# Patient Record
Sex: Female | Born: 1945
Health system: Southern US, Community
[De-identification: ages and names within clinical notes are randomized; demographics above are authoritative.]

## PROBLEM LIST (undated history)

## (undated) DIAGNOSIS — I10 Essential (primary) hypertension: Secondary | ICD-10-CM

## (undated) DIAGNOSIS — I639 Cerebral infarction, unspecified: Secondary | ICD-10-CM

## (undated) DIAGNOSIS — F419 Anxiety disorder, unspecified: Secondary | ICD-10-CM

## (undated) DIAGNOSIS — E785 Hyperlipidemia, unspecified: Secondary | ICD-10-CM

## (undated) HISTORY — DX: Hyperlipidemia, unspecified: E78.5

## (undated) HISTORY — DX: Essential (primary) hypertension: I10

## (undated) HISTORY — DX: Cerebral infarction, unspecified: I63.9

## (undated) HISTORY — PX: EYE SURGERY: SHX253

## (undated) HISTORY — DX: Anxiety disorder, unspecified: F41.9

## (undated) HISTORY — PX: TONSILLECTOMY: SUR1361

## (undated) HISTORY — PX: APPENDECTOMY: SHX54

---

## 1998-06-30 ENCOUNTER — Other Ambulatory Visit: Admission: RE | Admit: 1998-06-30 | Discharge: 1998-06-30 | Payer: Self-pay | Admitting: Obstetrics and Gynecology

## 2006-08-25 ENCOUNTER — Other Ambulatory Visit: Admission: RE | Admit: 2006-08-25 | Discharge: 2006-08-25 | Payer: Self-pay | Admitting: Family Medicine

## 2015-01-31 DIAGNOSIS — E559 Vitamin D deficiency, unspecified: Secondary | ICD-10-CM | POA: Diagnosis not present

## 2015-01-31 DIAGNOSIS — Z0000 Encounter for general adult medical examination without abnormal findings: Secondary | ICD-10-CM | POA: Diagnosis not present

## 2015-01-31 DIAGNOSIS — E785 Hyperlipidemia, unspecified: Secondary | ICD-10-CM | POA: Diagnosis not present

## 2015-01-31 DIAGNOSIS — F17210 Nicotine dependence, cigarettes, uncomplicated: Secondary | ICD-10-CM | POA: Diagnosis not present

## 2015-01-31 DIAGNOSIS — I10 Essential (primary) hypertension: Secondary | ICD-10-CM | POA: Diagnosis not present

## 2016-02-06 LAB — CBC AND DIFFERENTIAL
HEMATOCRIT: 47 % — AB (ref 36–46)
HEMOGLOBIN: 15.6 g/dL (ref 12.0–16.0)
Neutrophils Absolute: 4 /uL
Platelets: 211 10*3/uL (ref 150–399)
WBC: 7.2 10*3/mL

## 2016-02-06 LAB — VITAMIN D 25 HYDROXY (VIT D DEFICIENCY, FRACTURES): Vit D, 25-Hydroxy: 39

## 2016-02-06 LAB — BASIC METABOLIC PANEL
BUN: 12 mg/dL (ref 4–21)
CREATININE: 0.7 mg/dL (ref 0.5–1.1)
Glucose: 96 mg/dL
Potassium: 4.2 mmol/L (ref 3.4–5.3)
SODIUM: 143 mmol/L (ref 137–147)

## 2016-02-06 LAB — LIPID PANEL
CHOLESTEROL: 203 mg/dL — AB (ref 0–200)
HDL: 53 mg/dL (ref 35–70)
LDL Cholesterol: 120 mg/dL
TRIGLYCERIDES: 151 mg/dL (ref 40–160)

## 2016-02-06 LAB — HEPATIC FUNCTION PANEL
ALK PHOS: 81 U/L (ref 25–125)
ALT: 15 U/L (ref 7–35)
AST: 14 U/L (ref 13–35)
Bilirubin, Total: 0.6 mg/dL

## 2016-07-11 ENCOUNTER — Encounter: Payer: Self-pay | Admitting: Family Medicine

## 2016-07-11 ENCOUNTER — Telehealth: Payer: Self-pay

## 2016-07-11 ENCOUNTER — Ambulatory Visit (INDEPENDENT_AMBULATORY_CARE_PROVIDER_SITE_OTHER): Payer: Medicare Other | Admitting: Family Medicine

## 2016-07-11 VITALS — BP 140/82 | HR 77 | Resp 12 | Ht 63.0 in | Wt 158.2 lb

## 2016-07-11 DIAGNOSIS — M25531 Pain in right wrist: Secondary | ICD-10-CM | POA: Diagnosis not present

## 2016-07-11 DIAGNOSIS — M25511 Pain in right shoulder: Secondary | ICD-10-CM | POA: Diagnosis not present

## 2016-07-11 DIAGNOSIS — L02212 Cutaneous abscess of back [any part, except buttock]: Secondary | ICD-10-CM | POA: Diagnosis not present

## 2016-07-11 DIAGNOSIS — I10 Essential (primary) hypertension: Secondary | ICD-10-CM | POA: Diagnosis not present

## 2016-07-11 DIAGNOSIS — L723 Sebaceous cyst: Secondary | ICD-10-CM | POA: Diagnosis not present

## 2016-07-11 MED ORDER — SULFAMETHOXAZOLE-TRIMETHOPRIM 800-160 MG PO TABS: 1.0000 | ORAL_TABLET | Freq: Two times a day (BID) | ORAL | 0 refills | Status: AC

## 2016-07-11 MED ORDER — DICLOFENAC SODIUM 1 % TD GEL: 4.0000 g | Freq: Four times a day (QID) | TRANSDERMAL | 2 refills | Status: DC

## 2016-07-11 NOTE — Progress Notes (Signed)
HPI:   Danielle Trevino is a 71 y.o. female, who is here today with some new concerns and to follow on HTN. I saw Danielle Trevino last about 1-2 years ago at [facility], [place].   Last preventive routine visit: 01/2016.   HTN:  Dx over 10 years ago. She discontinue HCTZ about 12 months ago. Following a healthier diet and she is more consistent with low salt diet. BP reading 130's-low 140's/78-82.  Denies headache, visual changes, chest pain, dyspnea, palpitation, claudication, focal weakness, or edema.  Still smoking, has decreased some.  She has not been interested in preventive screening in the past as well as vaccination.  Concerns today:   Sebaceous cyst:  She has Hx of sebaceous cyst on her back, some have been excised. About 3-4 days ago she started with sudden pain on right mid back area, erythema, and local edema. Her husband drained purulent and odorous material, which helped some but pain is getting worse again, he tried to drain it again but he was unsuccessful. Pain is moderate to severe,"hurts badly". Pain is alleviated by taking Aleve or Tylenol, exacerbated by pressing area when laying down or siting back against chair. She denies chills, fever, decreased appetite, myalgias, or skin rash.   Right shoulder and wrist pain , intermittent for a few months. She denies prior history, pain is mild, achy, exacerbated by repetitive manual activities, alleviated by rest. Currently pain is much better because she stopped activities that were aggravating pain. She denies any trauma, limitation of movement, edema, or erythema.  She denies cervical pain, numbness, tingling, or weakness.   Review of Systems  Constitutional: Negative for activity change, appetite change, fatigue, fever and unexpected weight change.  HENT: Negative for mouth sores, nosebleeds and trouble swallowing.   Eyes: Negative for redness and visual disturbance.  Respiratory: Negative for cough,  shortness of breath and wheezing.   Cardiovascular: Negative for chest pain, palpitations and leg swelling.  Gastrointestinal: Negative for abdominal pain, nausea and vomiting.       Negative for changes in bowel habits.  Genitourinary: Negative for decreased urine volume and hematuria.  Musculoskeletal: Positive for arthralgias. Negative for joint swelling and myalgias.  Skin: Positive for rash. Negative for wound.  Neurological: Negative for syncope, weakness, numbness and headaches.  Psychiatric/Behavioral: Negative for confusion. The patient is not nervous/anxious.      No current outpatient prescriptions on file prior to visit.   No current facility-administered medications on file prior to visit.      Past Medical History:  Diagnosis Date  . Hypertension    Allergies  Allergen Reactions  . Erythromycin     IV    Family History  Problem Relation Age of Onset  . Arthritis Mother   . Heart disease Mother   . Arthritis Father   . Heart disease Father   . Diabetes Maternal Grandfather     Social History   Social History  . Marital status: Married    Spouse name: N/A  . Number of children: N/A  . Years of education: N/A   Social History Main Topics  . Smoking status: Current Every Day Smoker  . Smokeless tobacco: Never Used  . Alcohol use No  . Drug use: No  . Sexual activity: Not Asked   Other Topics Concern  . None   Social History Narrative  . None    Vitals:   07/11/16 1155  BP: 140/82  Pulse: 77  Resp:  12   O2 sat at RA 97% Body mass index is 28.03 kg/m.   Physical Exam  Nursing note and vitals reviewed. Constitutional: She is oriented to person, place, and time. She appears well-developed. No distress.  HENT:  Head: Atraumatic.  Mouth/Throat: Oropharynx is clear and moist and mucous membranes are normal.  Eyes: Conjunctivae and EOM are normal. Pupils are equal, round, and reactive to light.  Cardiovascular: Normal rate and regular  rhythm.   No murmur heard. Pulses:      Dorsalis pedis pulses are 2+ on the right side, and 2+ on the left side.  Respiratory: Effort normal and breath sounds normal. No respiratory distress.  GI: Soft. She exhibits no mass. There is no hepatomegaly. There is no tenderness.  Musculoskeletal: She exhibits no edema.  Right shoulder: No pain upon palpation. Juanetta GoslingHawkins' test neg, drop arm rotator cuff test neg, empty can supraspinatus test neg, cross body adduction test neg, lift-Off Subscapularis test neg. ROM normal but elicits mild pain.  There is no tenderness upon palpation of radial styloid, no edema or erythema appreciated, no limitation of wrist or MCP/IP joint ROM of right thumb. No pain elicited with Finkelstein maneuver.   Lymphadenopathy:       Head (right side): No occipital adenopathy present.       Head (left side): No occipital adenopathy present.    She has no cervical adenopathy.  Neurological: She is alert and oriented to person, place, and time. She has normal strength. Coordination normal.  Skin: Skin is warm. There is erythema.     7-8 cm indurated,ertyematous area, tender,no fluctuant area or active drainage.  Psychiatric: She has a normal mood and affect.  Well groomed, good eye contact.     ASSESSMENT AND PLAN:   Danielle BondsGloria was seen today for cyst on back.  Diagnoses and all orders for this visit:  Back abscess  Underlying sebaceous cyst. Lesion is not ready for I&D at this time. I recommend applying warm compressions. Monitor for worsening symptoms, in which case she may need to go to the ER. Oral antibiotic treatment and recommended. Instructed about warning signs. Follow-up in 7 days if needed.  -     sulfamethoxazole-trimethoprim (BACTRIM DS,SEPTRA DS) 800-160 MG tablet; Take 1 tablet by mouth 2 (two) times daily.  Sebaceous cyst  She has a few sebaceous cyst on her back, some has been infected a few times.She agrees with referral to dermatologist to  discuss excision of some.  -     Ambulatory referral to Dermatology  Hypertension, essential, benign  Adequately controlled otherwise. For now she will continue nonpharmacologic treatment. Caution with NSAIDs, some side effects discussed. DASH-low salt diet to continue. Eye exam recommended, last one about 2 years ago. Continue monitoring BP at home.  Arthralgia of right wrist  ? OA vs mild tenosynovitis. Examination today was otherwise negative. Recommended avoiding activities that could aggravate problem. She can take Tylenol OTC if needed. Follow-up as needed.  Right shoulder pain, unspecified chronicity  ? OA, rotator cuff tendinitis,radicular among some discussed. I don't think imaging is needed at this time. Pain seems to be better at this time. ROM exercises recommended. Topical diclofenac as needed for times per day. Follow-up as needed.   -     diclofenac sodium (VOLTAREN) 1 % GEL; Apply 4 g topically 4 (four) times daily.       Danielle G. SwazilandJordan, MD  [facility]. [place] office.

## 2016-07-11 NOTE — Patient Instructions (Signed)
A few things to remember from today's visit:   Back abscess - Plan: sulfamethoxazole-trimethoprim (BACTRIM DS,SEPTRA DS) 800-160 MG tablet  Sebaceous cyst - Plan: Ambulatory referral to Dermatology  Hypertension, essential, benign  Warm compresses, if drains you can apply gentle pressure or come back to have it open and drained.  If fever,chills,or sudden worsening please seem immediate medial attention.    Please be sure medication list is accurate. If a new problem present, please set up appointment sooner than planned today.

## 2016-07-11 NOTE — Telephone Encounter (Signed)
Received PA request for diclofenac sodium gel. PA submitted & is pending. Key:  [id number]

## 2016-07-12 NOTE — Telephone Encounter (Signed)
Pa approved, form faxed back to pharmacy. 

## 2016-07-17 ENCOUNTER — Telehealth: Payer: Self-pay | Admitting: Family Medicine

## 2016-07-17 NOTE — Telephone Encounter (Signed)
error 

## 2016-07-24 ENCOUNTER — Encounter: Payer: Self-pay | Admitting: Family Medicine

## 2017-01-31 ENCOUNTER — Encounter: Payer: Medicare Other | Admitting: Family Medicine

## 2017-02-27 NOTE — Progress Notes (Signed)
HPI:   [name] is a 72 y.o. female, who is here today for her routine physical.  Last CPE: 01/2016  Regular exercise 3 or more time per week: Walking Following a healthy diet: Yes.   Chronic medical problems: HTN,HLD, hearing loss (wears hearing aids), and generalized OA.   She has not been interested in pharmacologic treatments.  She lives with her husband. Independent ADL's and IADL's. No falls in the past year and denies depression symptoms.  Functional Status Survey: Is the patient deaf or have difficulty hearing?: Yes(Hearing aid.) Does the patient have difficulty seeing, even when wearing glasses/contacts?: No Does the patient have difficulty concentrating, remembering, or making decisions?: No Does the patient have difficulty walking or climbing stairs?: No Does the patient have difficulty dressing or bathing?: No Does the patient have difficulty doing errands alone such as visiting a doctor's office or shopping?: No  Fall Risk  02/28/2017  Falls in the past year? No     Providers she sees regularly:  Eye care provider: Last eye exam 3 years ago.   Depression screen [facility] 2/9 02/28/2017  Decreased Interest 0  Down, Depressed, Hopeless 0  PHQ - 2 Score 0    Mini-Cog - 02/28/17 0946    Normal clock drawing test?  yes         Hearing Screening   125Hz  250Hz  500Hz  1000Hz  2000Hz  3000Hz  4000Hz  6000Hz  8000Hz   Right ear:   Fail Fail Fail  Fail    Left ear:   Fail Fail Fail  Fail      Visual Acuity Screening   Right eye Left eye Both eyes  Without correction: 20/20 20/20 20/20   With correction:        There is no immunization history on file for this patient.  Mammogram: 02/2008 Colonoscopy: Never. DEXA: Never.  Hep C screening: She does not remember having it done, she is not interested.  She has no concerns today.   History of hypertension, she is currently on nonpharmacologic treatment. BP's at home "good" 130's/70's, less  than 150/90 in general. Denies severe/frequent headache, visual changes, chest pain, dyspnea, palpitation, claudication, focal weakness, or edema.  + Smoker, she has decreased number of cigarettes per day a pack of cigarettes last about 3 days.   Review of Systems  Constitutional: Negative for appetite change, fatigue and fever.  HENT: Negative for hearing loss, mouth sores, trouble swallowing and voice change.   Eyes: Negative for photophobia and visual disturbance.  Respiratory: Negative for cough, shortness of breath and wheezing.   Cardiovascular: Negative for chest pain and leg swelling.  Gastrointestinal: Negative for abdominal pain, nausea and vomiting.       No changes in bowel habits.  Endocrine: Negative for cold intolerance and heat intolerance.  Genitourinary: Negative for decreased urine volume, dysuria, hematuria, vaginal bleeding and vaginal discharge.  Musculoskeletal: Positive for arthralgias. Negative for gait problem and neck pain.  Skin: Negative for color change and rash.  Neurological: Negative for seizures, syncope, weakness, numbness and headaches.  Psychiatric/Behavioral: Negative for confusion and sleep disturbance. The patient is not nervous/anxious.   All other systems reviewed and are negative.     Current Outpatient Medications on File Prior to Visit  Medication Sig Dispense Refill  . diclofenac sodium (VOLTAREN) 1 % GEL Apply 4 g topically 4 (four) times daily. 4 Tube 2  . TURMERIC PO Take 1 tablet by mouth daily.     No current facility-administered medications on  file prior to visit.      Past Medical History:  Diagnosis Date  . Hypertension     Past Surgical History:  Procedure Laterality Date  . APPENDECTOMY    . TONSILLECTOMY      Allergies  Allergen Reactions  . Erythromycin     IV    Family History  Problem Relation Age of Onset  . Arthritis Mother   . Heart disease Mother   . Arthritis Father   . Heart disease Father   .  Diabetes Maternal Grandfather     Social History   Socioeconomic History  . Marital status: Married    Spouse name: None  . Number of children: None  . Years of education: None  . Highest education level: None  Social Needs  . Financial resource strain: None  . Food insecurity - worry: None  . Food insecurity - inability: None  . Transportation needs - medical: None  . Transportation needs - non-medical: None  Occupational History  . None  Tobacco Use  . Smoking status: Current Every Day Smoker  . Smokeless tobacco: Never Used  Substance and Sexual Activity  . Alcohol use: No  . Drug use: No  . Sexual activity: None  Other Topics Concern  . None  Social History Narrative  . None     Vitals:   02/28/17 0859  BP: 140/88  Pulse: 81  Resp: 12  Temp: 97.9 F (36.6 C)  SpO2: 98%   Body mass index is 29.43 kg/m.   Wt Readings from Last 3 Encounters:  02/28/17 166 lb 2 oz (75.4 kg)  07/11/16 158 lb 4 oz (71.8 kg)    Physical Exam  Nursing note and vitals reviewed. Constitutional: She is oriented to person, place, and time. She appears well-developed. No distress.  HENT:  Head: Normocephalic and atraumatic.  Right Ear: Tympanic membrane, external ear and ear canal normal.  Left Ear: Tympanic membrane, external ear and ear canal normal.  Mouth/Throat: Uvula is midline, oropharynx is clear and moist and mucous membranes are normal.  Grossly normal hearing,conversational level, with hearing aids.  Eyes: Conjunctivae and EOM are normal. Pupils are equal, round, and reactive to light.  Neck: No tracheal deviation present.  Cardiovascular: Normal rate and regular rhythm.  No murmur heard. Pulses:      Dorsalis pedis pulses are 2+ on the right side, and 2+ on the left side.  Respiratory: Effort normal and breath sounds normal. No respiratory distress.  GI: Soft. She exhibits no mass. There is no hepatomegaly. There is no tenderness.  Genitourinary:  Genitourinary  Comments: Breast:No masses, suspicious skin changes, nipple discharge appreciated bilaterally.  Musculoskeletal: She exhibits no edema.  No signs of synovitis or significant limitation of range of motion of major joints.  Lymphadenopathy:    She has no cervical adenopathy.    She has no axillary adenopathy.       Right: No supraclavicular adenopathy present.       Left: No supraclavicular adenopathy present.  Neurological: She is alert and oriented to person, place, and time. She has normal strength. No cranial nerve deficit. Coordination and gait normal.  Reflex Scores:      Bicep reflexes are 2+ on the right side and 2+ on the left side.      Patellar reflexes are 2+ on the right side and 2+ on the left side. Skin: Skin is warm. No rash noted. No erythema.  No suspicious lesions. Scattered SK lesions, mainly on  upper back, lower back, abdomen, and chest.  Psychiatric: She has a normal mood and affect. Her speech is normal.  Well groomed, good eye contact.      ASSESSMENT AND PLAN:   [name] was seen today for annual exam.  Diagnoses and all orders for this visit:  Lab Results  Component Value Date   CHOL 196 02/28/2017   HDL 48.80 02/28/2017   LDLCALC 120 (H) 02/28/2017   TRIG 136.0 02/28/2017   CHOLHDL 4 02/28/2017   Lab Results  Component Value Date   CREATININE 0.66 02/28/2017   BUN 16 02/28/2017   NA 141 02/28/2017   K 3.9 02/28/2017   CL 104 02/28/2017   CO2 31 02/28/2017   Lab Results  Component Value Date   ALT 11 02/28/2017   AST 13 02/28/2017   ALKPHOS 85 02/28/2017   BILITOT 0.6 02/28/2017    Medicare annual wellness visit, subsequent  We discussed the importance of staying active, physically and mentally, as well as the benefits of a healthy/balance diet. Low impact exercise that involve stretching and strengthing are ideal. We discussed preventive screening for the next 5-10 years, summery of recommendations given in AVS: She is not interested  in any screening test or vaccination, she has refused every time this has been discussed.  Fall prevention.  Advance directives and end of life discussed, she does have POA and living will.    Routine general medical examination at a health care facility  We discussed the importance of regular physical activity and healthy diet for prevention of chronic illness and/or complications. Preventive guidelines reviewed. Vaccination not up to date, refused vaccinations.  Ca++ and vit D supplementation recommended Next CPE in a year.  The 10-year ASCVD risk score Denman George DC Montez Hageman., et al., 2013) is: 21%   Values used to calculate the score:     Age: 71 years     Sex: Female     Is Non-Hispanic African American: No     Diabetic: No     Tobacco smoker: Yes     Systolic Blood Pressure: 140 mmHg     Is BP treated: No     HDL Cholesterol: 48.8 mg/dL     Total Cholesterol: 196 mg/dL  Hyperlipidemia, unspecified hyperlipidemia type  Continue nonpharmacologic treatment for now. Further recommendations will be given according to lab results as well as 10 years CVD risk score. Follow-up in 6-12 months.  -     Lipid panel  Hypertension, essential, benign  Adequately controlled. Continue nonpharmacologic treatment. Monitor BP at home. Instructed about warning signs. DASH-Low-salt diet recommended. Eye exam recommended annually. F/U in 12 months, before if needed.  -     Comprehensive metabolic panel  Hearing loss sensory, bilateral  She has not noted any problem with hearing loss while she is wearing her hearing aids and gross hearing during the visit is adequate.     Return in 1 year (on 02/28/2018) for routine.      Danielle Padin G. [place], MD  [facility]. [place] office.

## 2017-02-28 ENCOUNTER — Encounter: Payer: Self-pay | Admitting: Family Medicine

## 2017-02-28 ENCOUNTER — Ambulatory Visit (INDEPENDENT_AMBULATORY_CARE_PROVIDER_SITE_OTHER): Payer: Medicare Other | Admitting: Family Medicine

## 2017-02-28 VITALS — BP 140/88 | HR 81 | Temp 97.9°F | Resp 12 | Wt 166.1 lb

## 2017-02-28 DIAGNOSIS — I10 Essential (primary) hypertension: Secondary | ICD-10-CM | POA: Diagnosis not present

## 2017-02-28 DIAGNOSIS — Z0000 Encounter for general adult medical examination without abnormal findings: Secondary | ICD-10-CM

## 2017-02-28 DIAGNOSIS — E785 Hyperlipidemia, unspecified: Secondary | ICD-10-CM | POA: Diagnosis not present

## 2017-02-28 DIAGNOSIS — H903 Sensorineural hearing loss, bilateral: Secondary | ICD-10-CM | POA: Diagnosis not present

## 2017-02-28 LAB — COMPREHENSIVE METABOLIC PANEL
ALBUMIN: 4.3 g/dL (ref 3.5–5.2)
ALK PHOS: 85 U/L (ref 39–117)
ALT: 11 U/L (ref 0–35)
AST: 13 U/L (ref 0–37)
BILIRUBIN TOTAL: 0.6 mg/dL (ref 0.2–1.2)
BUN: 16 mg/dL (ref 6–23)
CALCIUM: 9.3 mg/dL (ref 8.4–10.5)
CO2: 31 mEq/L (ref 19–32)
Chloride: 104 mEq/L (ref 96–112)
Creatinine, Ser: 0.66 mg/dL (ref 0.40–1.20)
GFR: 93.45 mL/min (ref >60.00)
Glucose, Bld: 89 mg/dL (ref 70–99)
POTASSIUM: 3.9 meq/L (ref 3.5–5.1)
Sodium: 141 mEq/L (ref 135–145)
Total Protein: 6.4 g/dL (ref 6.0–8.3)

## 2017-02-28 LAB — LIPID PANEL
Cholesterol: 196 mg/dL (ref 0–200)
HDL: 48.8 mg/dL (ref >39.00)
LDL Cholesterol: 120 mg/dL — ABNORMAL HIGH (ref 0–99)
NONHDL: 146.93
TRIGLYCERIDES: 136 mg/dL (ref 0.0–149.0)
Total CHOL/HDL Ratio: 4
VLDL: 27.2 mg/dL (ref 0.0–40.0)

## 2017-02-28 NOTE — Patient Instructions (Addendum)
A few things to remember from today's visit:   Routine general medical examination at a health care facility  Hyperlipidemia, unspecified hyperlipidemia type - Plan: Lipid panel  Hypertension, essential, benign - Plan: Comprehensive metabolic panel   A few tips:  -As we age balance is not as good as it was, so there is a higher risks for falls. Please remove small rugs and furniture that is "in your way" and could increase the risk of falls. Stretching exercises may help with fall prevention: Yoga and Tai Chi are some examples. Low impact exercise is better, so you are not very achy the next day.  -Sun screen and avoidance of direct sun light recommended. Caution with dehydration, if working outdoors be sure to drink enough fluids.  - Some medications are not safe as we age, increases the risk of side effects and can potentially interact with other medication you are also taken;  including some of over the counter medications. Be sure to let me know when you start a new medication even if it is a dietary/vitamin supplement.   -Healthy diet low in red meet/animal fat and sugar + regular physical activity is recommended.       Screening schedule for the next 5-10 years:  Colon cancer screening until 75.  Glaucoma screening/eye exam every 1-2 years.  Mammogram for breast cancer screening annually.  Flu vaccine annually.  Diabetes and cholesterol screening : Done today  Fall prevention   Advance directives:  Please see a lawyer and/or go to this website to help you with advanced directives and designating a health care power of attorney so that your wishes will be followed should you become too ill to make your own medical decisions.  [email or web address]       Please be sure medication list is accurate. If a new problem present, please set up appointment sooner than planned today.

## 2017-05-23 ENCOUNTER — Ambulatory Visit: Payer: Self-pay

## 2017-05-23 NOTE — Telephone Encounter (Signed)
rec'd phone call from pt. Reported 3 separate episodes of vision change with seeing a jagged line in field of vision.  Reported one episode 2 weeks ago, one episode 2 days ago, and one this AM.  It has involved one or both eyes.  Also, reported episode yesterday morning of writing and noting that the letters looked like they were superimposed on each other; after vision cleared, she noted it was not that way.  Reported BP readings:  205/93 4/3, 173/89 @ 1:00 AM 4/4, 138/74 4/4, 150/71 4/5.  Stated she has been very tired for past 2 days, and "just don't feel like myself."  Stated she is under additional stress with father-in-law with vascular dementia.  Reported she is a smoker, but has put away the cigarettes since 05/20/17.  Is no longer on any blood pressure medications.  Advised to monitor BP BID and recore, avoid processed foods and monitor sodium intake, to reduce BP.  Try to find outlet for her stress. Care advice per protocol.  Encouraged to seek ER care if symptoms worsen.  Appt. Given for 05/26/17 with PCP.  Verb. Understanding; agrees with plan.         Reason for Disposition . [1] Brief (now gone) blurred vision AND [2] unexplained  Answer Assessment - Initial Assessment Questions 1. SYMPTOM: "What is the main symptom you are concerned about?" (e.g., weakness, numbness)     Seeing jagged line in bilat. vision 2. ONSET: "When did this start?" (minutes, hours, days; while sleeping)     It started a couple weeks ago; 3 separate events; last event today at 11:00 AM  3. LAST NORMAL: "When was the last time you were normal (no symptoms)?"     Haven't felt like myself for 2 days; c/o fatigue 4. PATTERN "Does this come and go, or has it been constant since it started?"  "Is it present now?"     Intermittent 5. CARDIAC SYMPTOMS: "Have you had any of the following symptoms: chest pain, difficulty breathing, palpitations?"     Denied chest pain, SOB, or heart palpitations.  6. NEUROLOGIC SYMPTOMS: "Have  you had any of the following symptoms: headache, dizziness, vision loss, double vision, changes in speech, unsteady on your feet?"     Denied H/A, dizziness, no speech difficulty, vision loss, unsteadiness, or numbness or weakness in extremities; was writing and noticed letters appeared to be on top of each other.  7. OTHER SYMPTOMS: "Do you have any other symptoms?"     Had BP 205/93, P 92 on 4/3, following an episode of seeing jagged line; 173/89, P 92 @ 1:00 AM, 4/4     4/4 BP 138/74, P 70; 4/5 BP 150/71, P 66  this AM  8. PREGNANCY: "Is there any chance you are pregnant?" "When was your last menstrual period?"     No  Answer Assessment - Initial Assessment Questions 1. DESCRIPTION: "What is the vision loss like? Describe it for me." (e.g., complete vision loss, blurred vision, double vision, floaters, etc.)     Seeing "jagged line" in bilateral vision; stated "I have floaters, and it didn't look like a floater." 2. LOCATION: "One or both eyes?" If one, ask: "Which eye?"     Episode 2 weeks ago, involved both eyes; episode 4/3 involved left eye; episode this AM involved both eyes  3. SEVERITY: "Can you see anything?" If so, ask: "What can you see?" (e.g., fine print)     Stated the episode lasted briefly 4. ONSET: "When  did this begin?" "Did it start suddenly or has this been gradual?"     3 separate episodes 5. PATTERN: "Does this come and go, or has it been constant since it started?"     Comes and goes 6. PAIN: "Is there any pain in your eye(s)?"  (Scale 1-10; or mild, moderate, severe)     Denied pain 7. CONTACTS-GLASSES: "Do you wear contacts or glasses?"     Did no ask 8. CAUSE: "What do you think is causing this visual problem?"     Unsure; under increased stress  9. OTHER SYMPTOMS: "Do you have any other symptoms?" (e.g., confusion, headache, arm or leg weakness, speech problems)     Denied headache, dizziness, speech problems, vision loss, weakness or numbness in  extremities 10. PREGNANCY: "Is there any chance you are pregnant?" "When was your last menstrual period?"       no  Protocols used: VISION LOSS OR CHANGE-A-AH, NEUROLOGIC DEFICIT-A-AH

## 2017-05-26 ENCOUNTER — Ambulatory Visit: Payer: Medicare Other | Admitting: Family Medicine

## 2017-05-26 ENCOUNTER — Encounter: Payer: Self-pay | Admitting: Family Medicine

## 2017-05-26 VITALS — BP 160/100 | HR 77 | Temp 99.0°F | Resp 12 | Ht 63.0 in | Wt 161.1 lb

## 2017-05-26 DIAGNOSIS — I10 Essential (primary) hypertension: Secondary | ICD-10-CM | POA: Diagnosis not present

## 2017-05-26 DIAGNOSIS — F419 Anxiety disorder, unspecified: Secondary | ICD-10-CM

## 2017-05-26 DIAGNOSIS — H539 Unspecified visual disturbance: Secondary | ICD-10-CM | POA: Diagnosis not present

## 2017-05-26 MED ORDER — LISINOPRIL-HYDROCHLOROTHIAZIDE 10-12.5 MG PO TABS: 1.0000 | ORAL_TABLET | Freq: Every day | ORAL | 0 refills | Status: DC

## 2017-05-26 NOTE — Progress Notes (Signed)
ACUTE VISIT   HPI:  Chief Complaint  Patient presents with  . Hypertension    has been elevated since Wednesday, say swiggly lines in left eye for 2 days    [name] is a 72 y.o. female, who is here today with her husband complaining of worsening hypertension and visual disturbances for the past 2-3 weeks, for the past 2 days seems to be getting worse.  She has history of hypertension, currently she is on nonpharmacologic treatment. She was taking HCTZ and still 1-2 years ago, she was able to control her BP with low-salt diet. Home BP readings: 150/78, 179/77, 206/94, 194/91, 182/86.  She has been under a lot of stress due to father-in-law illness.  She has not smoke for about a week.  "swiggly/jagged lines" on visual field, she has had 3 episodes in the past 2-3 weeks.  2 prior episodes on left visual field and last one was on both visual fields. No vision loss, eye pain, conjunctival erythema, headache, nausea, vomiting, focal deficit, or MS changes. Visual abnormalities usually last about 5 minutes. She has not identified exacerbating or alleviating factors. She already has an appointment with her ophthalmologist next Monday.  According to her husband, for a while she has had occasional similar episodes, thought to be associated to eating fried fish.   Visual Acuity Screening   Right eye Left eye Both eyes  Without correction: 20/20 20/20 20/20   With correction:       Review of Systems  Constitutional: Positive for fatigue. Negative for activity change, appetite change and fever.  HENT: Negative for mouth sores, nosebleeds and trouble swallowing.   Eyes: Positive for visual disturbance. Negative for pain and redness.  Respiratory: Negative for cough, shortness of breath and wheezing.   Cardiovascular: Negative for chest pain, palpitations and leg swelling.  Gastrointestinal: Negative for abdominal pain, nausea and vomiting.       Negative for changes in  bowel habits.  Endocrine: Negative for cold intolerance and heat intolerance.  Genitourinary: Negative for decreased urine volume, dysuria and hematuria.  Musculoskeletal: Negative for gait problem and myalgias.  Skin: Negative for pallor and rash.  Neurological: Negative for seizures, syncope, weakness, numbness and headaches.  Hematological: Negative for adenopathy. Does not bruise/bleed easily.  Psychiatric/Behavioral: Negative for confusion. The patient is nervous/anxious.       Current Outpatient Medications on File Prior to Visit  Medication Sig Dispense Refill  . TURMERIC PO Take 1 tablet by mouth daily.    . diclofenac sodium (VOLTAREN) 1 % GEL Apply 4 g topically 4 (four) times daily. (Patient not taking: Reported on 05/26/2017) 4 Tube 2   No current facility-administered medications on file prior to visit.      Past Medical History:  Diagnosis Date  . Hypertension    Allergies  Allergen Reactions  . Erythromycin     IV    Social History   Socioeconomic History  . Marital status: Married    Spouse name: Not on file  . Number of children: Not on file  . Years of education: Not on file  . Highest education level: Not on file  Occupational History  . Not on file  Social Needs  . Financial resource strain: Not on file  . Food insecurity:    Worry: Not on file    Inability: Not on file  . Transportation needs:    Medical: Not on file    Non-medical: Not on file  Tobacco Use  . Smoking status: Current Every Day Smoker  . Smokeless tobacco: Never Used  Substance and Sexual Activity  . Alcohol use: No  . Drug use: No  . Sexual activity: Not on file  Lifestyle  . Physical activity:    Days per week: Not on file    Minutes per session: Not on file  . Stress: Not on file  Relationships  . Social connections:    Talks on phone: Not on file    Gets together: Not on file    Attends religious service: Not on file    Active member of club or organization: Not  on file    Attends meetings of clubs or organizations: Not on file    Relationship status: Not on file  Other Topics Concern  . Not on file  Social History Narrative  . Not on file    Vitals:   05/26/17 1614  BP: (!) 160/100  Pulse: 77  Resp: 12  Temp: 99 F (37.2 C)   Body mass index is 28.54 kg/m.    Physical Exam  Nursing note and vitals reviewed. Constitutional: She is oriented to person, place, and time. She appears well-developed. No distress.  HENT:  Head: Normocephalic and atraumatic.  Mouth/Throat: Oropharynx is clear and moist and mucous membranes are normal.  Eyes: Pupils are equal, round, and reactive to light. Conjunctivae and EOM are normal.  Neck: No tracheal deviation present. No thyroid mass and no thyromegaly present.  Cardiovascular: Normal rate and regular rhythm.  No murmur heard. Pulses:      Dorsalis pedis pulses are 2+ on the right side, and 2+ on the left side.  Respiratory: Effort normal and breath sounds normal. No respiratory distress.  GI: Soft. She exhibits no mass. There is no hepatomegaly. There is no tenderness.  Musculoskeletal: She exhibits no edema.  Lymphadenopathy:    She has no cervical adenopathy.  Neurological: She is alert and oriented to person, place, and time. She has normal strength. No cranial nerve deficit. Coordination and gait normal.  Skin: Skin is warm. No rash noted. No erythema.  Psychiatric: She has a normal mood and affect.  Well groomed, good eye contact.    ASSESSMENT AND PLAN:   [name] was seen today for hypertension.  Orders Placed This Encounter  Procedures  . Basic metabolic panel  . CBC     Hypertension, essential, benign Not well controlled. Possible complications of elevated BP discussed. In the past she was on lisinopril HCTZ, well-tolerated. Lisinopril-HCTZ 10-12.5 mg started today.   Continue low-salt diet  Continue monitoring BP at home. Instructed about warning signs. Follow-up  in 3-4 weeks, before if needed.    Visual disturbance  Possible etiologies discussed including glaucoma or retinal abnormalities. It seems like she has had visual abnormalities occasionally for a while but seem more frequent for the past few days. She has appointment with her ophthalmologist next week, she was instructed to go to the ER if vision loss or worsening symptoms.   -     Basic metabolic panel -     CBC    Anxiety disorder, unspecified type  We discussed a few pharmacologic treatment options as well as side effects. For now we will hold on medication. Psychotherapy might help. We will consider low-dose of SSRI next visit.     Return in about 3 weeks (around 06/16/2017) for HTN.     -Ms.Lindley MagnusGloria H Enriques was advised to seek immediate medical attention if  sudden worsening symptoms.      Yuki Brunsman G. [place], MD  [facility]. [place] office.

## 2017-05-26 NOTE — Assessment & Plan Note (Signed)
Not well controlled. Possible complications of elevated BP discussed. In the past she was on lisinopril HCTZ, well-tolerated. Lisinopril-HCTZ 10-12.5 mg started today.   Continue low-salt diet  Continue monitoring BP at home. Instructed about warning signs. Follow-up in 3-4 weeks, before if needed.

## 2017-05-26 NOTE — Patient Instructions (Signed)
A few things to remember from today's visit:   Hypertension, essential, benign - Plan: lisinopril-hydrochlorothiazide (PRINZIDE,ZESTORETIC) 10-12.5 MG tablet, Basic metabolic panel, CBC  Visual disturbance - Plan: Basic metabolic panel, CBC   Please be sure medication list is accurate. If a new problem present, please set up appointment sooner than planned today.

## 2017-05-27 LAB — BASIC METABOLIC PANEL
BUN: 9 mg/dL (ref 6–23)
CHLORIDE: 104 meq/L (ref 96–112)
CO2: 31 meq/L (ref 19–32)
Calcium: 9.6 mg/dL (ref 8.4–10.5)
Creatinine, Ser: 0.7 mg/dL (ref 0.40–1.20)
GFR: 87.26 mL/min (ref >60.00)
Glucose, Bld: 83 mg/dL (ref 70–99)
POTASSIUM: 4.2 meq/L (ref 3.5–5.1)
Sodium: 142 mEq/L (ref 135–145)

## 2017-05-27 LAB — CBC
HEMATOCRIT: 46.1 % — AB (ref 36.0–46.0)
HEMOGLOBIN: 15.8 g/dL — AB (ref 12.0–15.0)
MCHC: 34.3 g/dL (ref 30.0–36.0)
MCV: 88.7 fl (ref 78.0–100.0)
Platelets: 220 10*3/uL (ref 150.0–400.0)
RBC: 5.19 Mil/uL — AB (ref 3.87–5.11)
RDW: 13.7 % (ref 11.5–15.5)
WBC: 7.4 10*3/uL (ref 4.0–10.5)

## 2017-05-29 ENCOUNTER — Ambulatory Visit: Payer: Medicare Other | Admitting: Family Medicine

## 2017-05-29 ENCOUNTER — Encounter: Payer: Self-pay | Admitting: Family Medicine

## 2017-05-29 VITALS — BP 150/72 | HR 67 | Temp 98.1°F | Ht 63.0 in | Wt 159.4 lb

## 2017-05-29 DIAGNOSIS — I10 Essential (primary) hypertension: Secondary | ICD-10-CM

## 2017-05-29 NOTE — Progress Notes (Signed)
   Subjective:    Patient ID: Danielle Trevino, female    DOB: 04/10/1945, 72 y.o.   MRN: [id number]  HPI Here with her husband to ask about BP. She was seen here 4 days ago by Dr. [place]Jordan for elevated BP at home to 180 systolic, while the diastolics always remain below 90. She had some blurred vision but denied any headache or SOB or chest pain. She had a CBC and BMET drawn that day, all of which was normal. She was started on Lisinopril HCT that day. Since then she has been getting some high BP readings at home. With the highest yesterday at 179/872. She saw her eye doctor this morning, and other than some early cataracts her eyes are fine. She admits to being under a lot of stress lately because her husband's father has worsening dementia, and she worries about him.    Review of Systems  Constitutional: Negative.   Respiratory: Negative.   Cardiovascular: Negative.   Neurological: Negative.        Objective:   Physical Exam  Constitutional: She is oriented to person, place, and time. She appears well-developed and well-nourished.  Cardiovascular: Normal rate, regular rhythm, normal heart sounds and intact distal pulses.  Pulmonary/Chest: Effort normal and breath sounds normal. No respiratory distress. She has no wheezes. She has no rales.  Musculoskeletal: She exhibits no edema.  Neurological: She is alert and oriented to person, place, and time.          Assessment & Plan:  I reassured them that her BP is elevated but not to an acutely dangerous level. The new BP medication will help but it takes at least a week before it can reach a therapeutic level in her body. I suggested that give this a little more time to start working. Check the BP no more than twice a day. Stress is certainly playing a role, so I suggested she get some exercise to help with this. Follow up with Dr. [place]Jordan as scheduled in about 2 weeks.  Gershon CraneStephen Brooklyne Radke, MD

## 2017-06-01 ENCOUNTER — Encounter: Payer: Self-pay | Admitting: Family Medicine

## 2017-06-15 NOTE — Progress Notes (Signed)
Danielle Trevino is a 72 y.o.female, who is here today with her husband to follow on HTN. Last seen on 05/26/17, when BP was elevated, so started on Lisinopril-HCTZ 10-12.5 mg daily.  She is taking medications as instructed.  15 minutes after taking medication she feels fatigue/sleepy, improves after taking a nap.  Still having elevated BPs intermittently,minly at night. 110's-170's/70-80's   Since her last visit she has seen her eye care provider, otherwise negative examination. She was told she is having "visual auras.", related to elevated BPs.  She has not noted unusual headache, visual changes, exertional chest pain, dyspnea,focal weakness, or edema.    Lab Results  Component Value Date   CREATININE 0.70 05/26/2017   BUN 9 05/26/2017   NA 142 05/26/2017   K 4.2 05/26/2017   CL 104 05/26/2017   CO2 31 05/26/2017    She has not smoked in a month.   Review of Systems  Constitutional: Positive for fatigue. Negative for activity change, appetite change and fever.  HENT: Negative for mouth sores and nosebleeds.   Eyes: Negative for redness and visual disturbance.  Respiratory: Negative for cough, shortness of breath and wheezing.   Cardiovascular: Negative for chest pain, palpitations and leg swelling.  Gastrointestinal: Negative for abdominal pain, nausea and vomiting.       Negative for changes in bowel habits.  Genitourinary: Negative for decreased urine volume and hematuria.  Neurological: Negative for syncope, weakness and headaches.     Current Outpatient Medications on File Prior to Visit  Medication Sig Dispense Refill  . lisinopril-hydrochlorothiazide (PRINZIDE,ZESTORETIC) 10-12.5 MG tablet Take 1 tablet by mouth daily. 90 tablet 0  . TURMERIC PO Take 1 tablet by mouth daily.     No current facility-administered medications on file prior to visit.      Past Medical History:  Diagnosis Date  . Hypertension     Allergies  Allergen Reactions  .  Erythromycin     IV    Social History   Socioeconomic History  . Marital status: Married    Spouse name: Not on file  . Number of children: Not on file  . Years of education: Not on file  . Highest education level: Not on file  Occupational History  . Not on file  Social Needs  . Financial resource strain: Not on file  . Food insecurity:    Worry: Not on file    Inability: Not on file  . Transportation needs:    Medical: Not on file    Non-medical: Not on file  Tobacco Use  . Smoking status: Former Smoker    Last attempt to quit: 05/19/2017    Years since quitting: 0.0  . Smokeless tobacco: Never Used  Substance and Sexual Activity  . Alcohol use: No  . Drug use: No  . Sexual activity: Not on file  Lifestyle  . Physical activity:    Days per week: Not on file    Minutes per session: Not on file  . Stress: Not on file  Relationships  . Social connections:    Talks on phone: Not on file    Gets together: Not on file    Attends religious service: Not on file    Active member of club or organization: Not on file    Attends meetings of clubs or organizations: Not on file    Relationship status: Not on file  Other Topics Concern  . Not on file  Social History Narrative  .  Not on file    Vitals:   06/16/17 1534  BP: 124/80  Pulse: 66  Resp: 12  Temp: 98.2 F (36.8 C)  SpO2: 97%   Body mass index is 29.23 kg/m.   Physical Exam  Nursing note and vitals reviewed. Constitutional: She is oriented to person, place, and time. She appears well-developed. No distress.  HENT:  Head: Normocephalic and atraumatic.  Mouth/Throat: Oropharynx is clear and moist and mucous membranes are normal.  Eyes: Pupils are equal, round, and reactive to light. Conjunctivae are normal.  Cardiovascular: Normal rate and regular rhythm.  No murmur heard. Pulses:      Dorsalis pedis pulses are 2+ on the right side, and 2+ on the left side.  Respiratory: Effort normal and breath sounds  normal. No respiratory distress.  GI: Soft. She exhibits no mass. There is no hepatomegaly. There is no tenderness.  Musculoskeletal: She exhibits no edema.  Lymphadenopathy:    She has no cervical adenopathy.  Neurological: She is alert and oriented to person, place, and time. She has normal strength. Gait normal.  Skin: Skin is warm. No erythema.  Psychiatric: She has a normal mood and affect.  Well groomed, good eye contact.    ASSESSMENT AND PLAN:   Danielle Trevino was seen today for follow-up.  Orders Placed This Encounter  Procedures  . Basic metabolic panel    Hypertension, essential, benign BP improved but is still having some elevated SBP's. She would like to continue same dose of lisinopril.  She agrees with changing to HCTZ 25 mg in the morning and Lisinopril 10 mg at night. If BP still elevated in 1 to 2 weeks, instructed to increase lisinopril from 10 mg to 20 mg. Continue low salt diet. We discussed possible complications of elevated BP. Follow-up in 3 months.   I am very glad she finally quit the smoking, we discussed benefits upon doing, congratulated.   -Danielle Trevino is to return sooner than planned today if new concerns arise.     Betty G. [place], MD  [facility]. [place] office.

## 2017-06-16 ENCOUNTER — Encounter: Payer: Self-pay | Admitting: Family Medicine

## 2017-06-16 ENCOUNTER — Ambulatory Visit: Payer: Medicare Other | Admitting: Family Medicine

## 2017-06-16 VITALS — BP 124/80 | HR 66 | Temp 98.2°F | Resp 12 | Ht 63.0 in | Wt 165.0 lb

## 2017-06-16 DIAGNOSIS — I10 Essential (primary) hypertension: Secondary | ICD-10-CM

## 2017-06-16 MED ORDER — HYDROCHLOROTHIAZIDE 25 MG PO TABS: 25.0000 mg | ORAL_TABLET | Freq: Every day | ORAL | 0 refills | Status: DC

## 2017-06-16 MED ORDER — LISINOPRIL 10 MG PO TABS: 10.0000 mg | ORAL_TABLET | Freq: Every day | ORAL | 0 refills | Status: DC

## 2017-06-16 NOTE — Assessment & Plan Note (Addendum)
BP improved but is still having some elevated SBP's. She would like to continue same dose of lisinopril.  She agrees with changing to HCTZ 25 mg in the morning and Lisinopril 10 mg at night. If BP still elevated in 1 to 2 weeks, instructed to increase lisinopril from 10 mg to 20 mg. We discussed some side effects of medications. Continue low salt diet. We discussed possible complications of elevated BP. Follow-up in 3 months.

## 2017-06-16 NOTE — Patient Instructions (Addendum)
A few things to remember from today's visit:   Hypertension, essential, benign - Plan: Basic metabolic panel  Hydrochlorothiazide 25 mg in the morning and lisinopril 10 mg at bedtime. It blood pressure still elevated in 1 to 2 weeks we will increase lisinopril from 10 to 20 mg.      Hypertension Hypertension is another name for high blood pressure. High blood pressure forces your heart to work harder to pump blood. This can cause problems over time. There are two numbers in a blood pressure reading. There is a top number (systolic) over a bottom number (diastolic). It is best to have a blood pressure below 120/80. Healthy choices can help lower your blood pressure. You may need medicine to help lower your blood pressure if:  Your blood pressure cannot be lowered with healthy choices.  Your blood pressure is higher than 130/80.  Follow these instructions at home: Eating and drinking  If directed, follow the DASH eating plan. This diet includes: ? Filling half of your plate at each meal with fruits and vegetables. ? Filling one quarter of your plate at each meal with whole grains. Whole grains include whole wheat pasta, brown rice, and whole grain bread. ? Eating or drinking low-fat dairy products, such as skim milk or low-fat yogurt. ? Filling one quarter of your plate at each meal with low-fat (lean) proteins. Low-fat proteins include fish, skinless chicken, eggs, beans, and tofu. ? Avoiding fatty meat, cured and processed meat, or chicken with skin. ? Avoiding premade or processed food.  Eat less than 1,500 mg of salt (sodium) a day.  Limit alcohol use to no more than 1 drink a day for nonpregnant women and 2 drinks a day for men. One drink equals 12 oz of beer, 5 oz of wine, or 1 oz of hard liquor. Lifestyle  Work with your doctor to stay at a healthy weight or to lose weight. Ask your doctor what the best weight is for you.  Get at least 30 minutes of exercise that causes your  heart to beat faster (aerobic exercise) most days of the week. This may include walking, swimming, or biking.  Get at least 30 minutes of exercise that strengthens your muscles (resistance exercise) at least 3 days a week. This may include lifting weights or pilates.  Do not use any products that contain nicotine or tobacco. This includes cigarettes and e-cigarettes. If you need help quitting, ask your doctor.  Check your blood pressure at home as told by your doctor.  Keep all follow-up visits as told by your doctor. This is important. Medicines  Take over-the-counter and prescription medicines only as told by your doctor. Follow directions carefully.  Do not skip doses of blood pressure medicine. The medicine does not work as well if you skip doses. Skipping doses also puts you at risk for problems.  Ask your doctor about side effects or reactions to medicines that you should watch for. Contact a doctor if:  You think you are having a reaction to the medicine you are taking.  You have headaches that keep coming back (recurring).  You feel dizzy.  You have swelling in your ankles.  You have trouble with your vision. Get help right away if:  You get a very bad headache.  You start to feel confused.  You feel weak or numb.  You feel faint.  You get very bad pain in your: ? Chest. ? Belly (abdomen).  You throw up (vomit) more than once.  You have trouble breathing. Summary  Hypertension is another name for high blood pressure.  Making healthy choices can help lower blood pressure. If your blood pressure cannot be controlled with healthy choices, you may need to take medicine. This information is not intended to replace advice given to you by your health care provider. Make sure you discuss any questions you have with your health care provider. Document Released: 07/24/2007 Document Revised: 01/03/2016 Document Reviewed: 01/03/2016 Elsevier Interactive Patient  Education  2018 [employer].  Please be sure medication list is accurate. If a new problem present, please set up appointment sooner than planned today.

## 2017-06-17 LAB — BASIC METABOLIC PANEL
BUN: 16 mg/dL (ref 6–23)
CALCIUM: 9.4 mg/dL (ref 8.4–10.5)
CO2: 28 meq/L (ref 19–32)
CREATININE: 0.75 mg/dL (ref 0.40–1.20)
Chloride: 102 mEq/L (ref 96–112)
GFR: 80.57 mL/min (ref >60.00)
Glucose, Bld: 75 mg/dL (ref 70–99)
Potassium: 4.1 mEq/L (ref 3.5–5.1)
Sodium: 141 mEq/L (ref 135–145)

## 2017-06-22 ENCOUNTER — Encounter: Payer: Self-pay | Admitting: Family Medicine

## 2017-07-16 ENCOUNTER — Ambulatory Visit: Payer: Medicare Other | Admitting: Family Medicine

## 2017-07-16 ENCOUNTER — Ambulatory Visit: Payer: Self-pay

## 2017-07-16 NOTE — Telephone Encounter (Signed)
Noted  

## 2017-07-16 NOTE — Telephone Encounter (Signed)
Telephone call from pt. Stating her blood  Was elevated last night at 3:30 am .  Value was 160/ 83.  Hr 89  Pt. Stated another value was  169/77. Latest value quoted today  was 176/ 68 hr 79.    Denies any SOB, dizziness, nor blurred vision.  States she had a headsache  Pt.  Took extra strength tylenol . Stated it did provide  relief.   Question pt if she was drinking plenty of fluids, and monitoring her salt intake .Pt. Stated she had a salad for dinner.  Pt. Also states she is taking her  medications as directed.  Provided care advice recommendations to  pt. Voiced understanding.  Appt. Scheduled.  Reason for Disposition . Healthy diet, questions about  Answer Assessment - Initial Assessment Questions 1. BLOOD PRESSURE: "What is the blood pressure?" "Did you take at least two measurements 5 minutes apart?"     176/68 double pump 2. ONSET: "When did you take your blood pressure?"     Last night  330am 3. HOW: "How did you obtain the blood pressure?" (e.g., visiting nurse, automatic home BP monitor)    Digital blood pressure machine 4. HISTORY: "Do you have a history of high blood pressure?"     Dx in April2019 5. MEDICATIONS: "Are you taking any medications for blood pressure?" "Have you missed any doses recently?"     HCTZ 6. OTHER SYMPTOMS: "Do you have any symptoms?" (e.g., headache, chest pain, blurred vision, difficulty breathing, weakness) No  Protocols used: HIGH BLOOD PRESSURE-A-AH

## 2017-07-17 ENCOUNTER — Ambulatory Visit: Payer: Medicare Other | Admitting: Family Medicine

## 2017-07-17 ENCOUNTER — Encounter: Payer: Self-pay | Admitting: Family Medicine

## 2017-07-17 VITALS — BP 150/80 | HR 84 | Temp 98.2°F | Resp 12 | Ht 63.0 in | Wt 162.6 lb

## 2017-07-17 DIAGNOSIS — F419 Anxiety disorder, unspecified: Secondary | ICD-10-CM

## 2017-07-17 DIAGNOSIS — R51 Headache: Secondary | ICD-10-CM | POA: Diagnosis not present

## 2017-07-17 DIAGNOSIS — R519 Headache, unspecified: Secondary | ICD-10-CM

## 2017-07-17 DIAGNOSIS — I10 Essential (primary) hypertension: Secondary | ICD-10-CM | POA: Diagnosis not present

## 2017-07-17 MED ORDER — DIAZEPAM 5 MG PO TABS: 2.5000 mg | ORAL_TABLET | Freq: Every day | ORAL | 0 refills | Status: DC | PRN

## 2017-07-17 NOTE — Assessment & Plan Note (Signed)
After discussion of few options, including pharmacologic (SSRI/prn benzo) and cognitive therapy, she would like to try diazepam daily as needed. We discussed some side effects of benzodiazepines in general. She will try Diazepam 5 mg 0.5 to 1 tablet daily as needed. She is familiar with relaxation/meditation exercises,whihc she does at church Tuesdays.Recommend doing it daily. She will keep next follow-up appointment.

## 2017-07-17 NOTE — Patient Instructions (Addendum)
A few things to remember from today's visit:   Hypertension, essential, benign  Anxiety disorder, unspecified type - Plan: diazepam (VALIUM) 5 MG tablet  Nonintractable episodic headache, unspecified headache type - Plan: MR Brain Wo Contrast  Other options are clonazepam or propranolol as needed. Keep next appointment. Continue monitoring BP.    Please be sure medication list is accurate. If a new problem present, please set up appointment sooner than planned today.

## 2017-07-17 NOTE — Progress Notes (Addendum)
ACUTE VISIT   HPI:  Chief Complaint  Patient presents with  . Hypertension    Danielle Trevino is a 72 y.o. female, who is here today with her husband complaining of elevated BP since yesterday, 170's/60's She is currently on HCTZ 25 mg in the morning and Lisinopril 10 mg at night. BP readings before yesterday 110s-120s/60-70s. She attributes her elevated BP to stress. This morning BP was better, 133/76.  Her father-in-law has been sick and usually she takes care of him on Wednesdays, his caregiver (daughter) has had shoulder issues.  Negative for chest pain, dyspnea, palpitation, claudication, focal weakness, or edema.  Lab Results  Component Value Date   CREATININE 0.75 06/16/2017   BUN 16 06/16/2017   NA 141 06/16/2017   K 4.1 06/16/2017   CL 102 06/16/2017   CO2 28 06/16/2017   Yesterday she had headache for 12 hours, 4 hours of severe headache then mild.  Headache was preceded by right visual "aura", which she has described before as "swiggly/jagged lines in peripheral visual field,transiend. She has been successful with smoking cessation. + HLD.   She has had eye exam within the past 3 months and reported as negative. No focal deficit associated. She has not identified exacerbating or alleviating factors for headache. She has taken Tylenol a few times.    + Anxiety,no prior Hx. Family stress and friends' illness seems to exacerbate problem. She has felt like crying a few times since her last visit, no history of depression.  Negative suicidal thoughts.   Review of Systems  Constitutional: Negative for activity change, appetite change, fatigue and fever.  HENT: Negative for mouth sores, nosebleeds and trouble swallowing.   Eyes: Positive for visual disturbance. Negative for redness.  Respiratory: Negative for cough, shortness of breath and wheezing.   Cardiovascular: Negative for chest pain, palpitations and leg swelling.  Gastrointestinal:  Negative for abdominal pain, nausea and vomiting.       Negative for changes in bowel habits.  Genitourinary: Negative for decreased urine volume and hematuria.  Neurological: Positive for headaches. Negative for syncope and weakness.  Psychiatric/Behavioral: Negative for confusion. The patient is nervous/anxious.       Current Outpatient Medications on File Prior to Visit  Medication Sig Dispense Refill  . hydrochlorothiazide (HYDRODIURIL) 25 MG tablet Take 1 tablet (25 mg total) by mouth daily. 90 tablet 0  . lisinopril (PRINIVIL,ZESTRIL) 10 MG tablet Take 1 tablet (10 mg total) by mouth daily. 90 tablet 0  . TURMERIC PO Take 1 tablet by mouth daily.     No current facility-administered medications on file prior to visit.      Past Medical History:  Diagnosis Date  . Hypertension    Allergies  Allergen Reactions  . Erythromycin     IV    Social History   Socioeconomic History  . Marital status: Married    Spouse name: Not on file  . Number of children: Not on file  . Years of education: Not on file  . Highest education level: Not on file  Occupational History  . Not on file  Social Needs  . Financial resource strain: Not on file  . Food insecurity:    Worry: Not on file    Inability: Not on file  . Transportation needs:    Medical: Not on file    Non-medical: Not on file  Tobacco Use  . Smoking status: Former Smoker    Last attempt to quit:  05/19/2017    Years since quitting: 0.1  . Smokeless tobacco: Never Used  Substance and Sexual Activity  . Alcohol use: No  . Drug use: No  . Sexual activity: Not on file  Lifestyle  . Physical activity:    Days per week: Not on file    Minutes per session: Not on file  . Stress: Not on file  Relationships  . Social connections:    Talks on phone: Not on file    Gets together: Not on file    Attends religious service: Not on file    Active member of club or organization: Not on file    Attends meetings of clubs or  organizations: Not on file    Relationship status: Not on file  Other Topics Concern  . Not on file  Social History Narrative  . Not on file    Vitals:   07/17/17 1134 07/17/17 1215  BP: (!) 150/84 (!) 150/80  Pulse: 84   Resp: 12   Temp: 98.2 F (36.8 C)   SpO2: 97%    Body mass index is 28.8 kg/m.   Physical Exam  Nursing note and vitals reviewed. Constitutional: She is oriented to person, place, and time. She appears well-developed. No distress.  HENT:  Head: Normocephalic and atraumatic.  Mouth/Throat: Oropharynx is clear and moist and mucous membranes are normal.  Eyes: Pupils are equal, round, and reactive to light. Conjunctivae and EOM are normal.  Cardiovascular: Normal rate and regular rhythm.  No murmur heard. Pulses:      Dorsalis pedis pulses are 2+ on the right side, and 2+ on the left side.  Respiratory: Effort normal and breath sounds normal. No respiratory distress.  GI: Soft. She exhibits no mass. There is no hepatomegaly. There is no tenderness.  Musculoskeletal: She exhibits no edema.  Lymphadenopathy:    She has no cervical adenopathy.  Neurological: She is alert and oriented to person, place, and time. She has normal strength. No cranial nerve deficit. Gait normal.  Skin: Skin is warm. No erythema.  Psychiatric: She has a normal mood and affect.  Well groomed, good eye contact.    ASSESSMENT AND PLAN:  Ms. Portia was seen today for hypertension.  Orders Placed This Encounter  Procedures  . MR Brain Wo Contrast    Nonintractable episodic headache, unspecified headache type  No prior history of headache, given the fact that this is associated with elevated BP and visual disturbances, I think is appropriate to obtain brain imaging. She was instructed about warning signs. She can take Tylenol 325 mg every 4 hours as needed. Avoid NSAIDs.  -     MR Brain Wo Contrast; Future   Hypertension, essential, benign BP elevated today. She has had  adequate BP is prior to yesterday. It seems to be related to stress. For now she will continue current management. One option is to add Propranolol daily as needed for elevated BP. Continue monitoring BP regularly. Instructed about warning signs. She  will keep her next office visit.  Anxiety disorder, unspecified After discussion of few options, including pharmacologic (SSRI/prn benzo) and cognitive therapy, she would like to try diazepam daily as needed. We discussed some side effects of benzodiazepines in general. She will try Diazepam 5 mg 0.5 to 1 tablet daily as needed. She is familiar with relaxation/meditation exercises,whihc she does at church Tuesdays.Recommend doing it daily. She will keep next follow-up appointment.     Return if symptoms worsen or fail to improve,  for Keep next appt.       Danielle Tietje G. [place], MD  [facility]. [place] office.

## 2017-07-17 NOTE — Assessment & Plan Note (Addendum)
BP elevated today. She has had adequate BP is prior to yesterday. It seems to be related to stress. For now she will continue current management. One option is to add Propranolol daily as needed for elevated BP. Continue monitoring BP regularly. Instructed about warning signs. She  will keep her next office visit.

## 2017-07-26 ENCOUNTER — Ambulatory Visit
Admission: RE | Admit: 2017-07-26 | Discharge: 2017-07-26 | Disposition: A | Discharge status: Home / Self Care | Payer: Medicare Other | Source: Ambulatory Visit | Attending: Family Medicine | Admitting: Family Medicine

## 2017-07-26 ENCOUNTER — Telehealth: Payer: Self-pay | Admitting: Family Medicine

## 2017-07-26 DIAGNOSIS — R519 Headache, unspecified: Secondary | ICD-10-CM

## 2017-07-26 DIAGNOSIS — R51 Headache: Principal | ICD-10-CM

## 2017-07-26 NOTE — Telephone Encounter (Signed)
Called by Dr. Margo AyeHall of radiology team regarding results of MRI, a small lacunar infarct was noted in posterior fossa area. Dr. Margo AyeHall did not feel this was severe enough to warrant inpatient workup, but wanted primary care team to be aware. Reviewing chart, Dr. SwazilandJordan ordered this test on 5/30, thus nothing acute to be done at this time. I did not notify patient of these results.  Will route to Dr. SwazilandJordan.

## 2017-07-28 ENCOUNTER — Encounter: Payer: Self-pay | Admitting: Family Medicine

## 2017-07-28 ENCOUNTER — Other Ambulatory Visit: Payer: Self-pay | Admitting: Family Medicine

## 2017-07-28 ENCOUNTER — Ambulatory Visit: Payer: Medicare Other | Admitting: Family Medicine

## 2017-07-28 DIAGNOSIS — R51 Headache: Principal | ICD-10-CM

## 2017-07-28 DIAGNOSIS — R519 Headache, unspecified: Secondary | ICD-10-CM

## 2017-07-28 NOTE — Telephone Encounter (Signed)
Results of brain MRI were discussed with patient. She is not interested in statin. She started Aspirin 81 mg daily. Neurology referral was placed.  [name], MD

## 2017-07-28 NOTE — Progress Notes (Signed)
neurolo

## 2017-07-30 ENCOUNTER — Encounter: Payer: Self-pay | Admitting: Neurology

## 2017-07-30 ENCOUNTER — Ambulatory Visit: Payer: Medicare Other | Admitting: Neurology

## 2017-07-30 VITALS — BP 124/76 | HR 87 | Ht 62.0 in | Wt 159.0 lb

## 2017-07-30 DIAGNOSIS — I639 Cerebral infarction, unspecified: Secondary | ICD-10-CM | POA: Diagnosis not present

## 2017-07-30 MED ORDER — ATORVASTATIN CALCIUM 10 MG PO TABS: 10.0000 mg | ORAL_TABLET | Freq: Every day | ORAL | 1 refills | Status: DC

## 2017-07-30 NOTE — Progress Notes (Signed)
NEUROLOGY CONSULTATION NOTE  [name] MRN: [id number] DOB: [birth year]  Referring provider: Betty [place], MD Primary care provider: Betty [place], MD  Reason for consult:  Stroke, headache  HISTORY OF PRESENT ILLNESS: Danielle Trevino is a 72 year old right-handed female with hypertension who presents for headache and stroke.  She is accompanied by her husband who supplements history.  History also supplemented by referring provider's note.  In April, she began experiencing episodes of visual disturbance, described as bright jagged lines or ovals in her peripheral vision, lasting 10 minutes.  Sometimes it involves the left eye and sometimes the right eye.  Afterward, she feels fatigued and heaviness in her head for about an hour.  They occur about 10 to 15 days a month.  Around the same time, she began experiencing elevated blood pressure, sometimes as high as 180s systolic.  She saw the optometrist who found nothing concerning and told her the visual symptoms were due to vascular changes in the eye secondary to hypertension.  On May 30, she developed a headache on the top of her head.  Her PCP ordered an MRI of the brain, which was performed on 07/26/17 and personally reviewed, showing an acute lacunar infarct in the right cerebellum, PICA territory.  She was advised to start ASA 81mg  daily.  She reports no vertigo, unsteady gait, unilateral numbness or weakness.  She continues to have visual auras.  She reports that she quit smoking 3 months ago.  02/28/17 LDL 120. She denies history of migraines.  PAST MEDICAL HISTORY: Past Medical History:  Diagnosis Date  . Hypertension     PAST SURGICAL HISTORY: Past Surgical History:  Procedure Laterality Date  . APPENDECTOMY    . TONSILLECTOMY      MEDICATIONS: Current Outpatient Medications on File Prior to Visit  Medication Sig Dispense Refill  . diazepam (VALIUM) 5 MG tablet Take 0.5-1 tablets (2.5-5 mg total) by mouth daily as needed  for anxiety. 20 tablet 0  . hydrochlorothiazide (HYDRODIURIL) 25 MG tablet Take 1 tablet (25 mg total) by mouth daily. 90 tablet 0  . lisinopril (PRINIVIL,ZESTRIL) 10 MG tablet Take 1 tablet (10 mg total) by mouth daily. 90 tablet 0  . TURMERIC PO Take 1 tablet by mouth daily.     No current facility-administered medications on file prior to visit.     ALLERGIES: Allergies  Allergen Reactions  . Erythromycin     IV    FAMILY HISTORY: Family History  Problem Relation Age of Onset  . Arthritis Mother   . Heart disease Mother   . Arthritis Father   . Heart disease Father   . Diabetes Maternal Grandfather     SOCIAL HISTORY: Social History   Socioeconomic History  . Marital status: Married    Spouse name: Amada Jupiter  . Number of children: Not on file  . Years of education: Not on file  . Highest education level: Bachelor's degree (e.g., BA, AB, BS)  Occupational History  . Not on file  Social Needs  . Financial resource strain: Not on file  . Food insecurity:    Worry: Not on file    Inability: Not on file  . Transportation needs:    Medical: Not on file    Non-medical: Not on file  Tobacco Use  . Smoking status: Former Smoker    Last attempt to quit: 05/19/2017    Years since quitting: 0.1  . Smokeless tobacco: Never Used  Substance and Sexual Activity  .  Alcohol use: No  . Drug use: No  . Sexual activity: Not on file  Lifestyle  . Physical activity:    Days per week: Not on file    Minutes per session: Not on file  . Stress: Not on file  Relationships  . Social connections:    Talks on phone: Not on file    Gets together: Not on file    Attends religious service: Not on file    Active member of club or organization: Not on file    Attends meetings of clubs or organizations: Not on file    Relationship status: Not on file  . Intimate partner violence:    Fear of current or ex partner: Not on file    Emotionally abused: Not on file    Physically abused: Not on  file    Forced sexual activity: Not on file  Other Topics Concern  . Not on file  Social History Narrative   Patient is right-handed. She lives with her husband in a one story house. She avoids caffeine. She does not exercise. She is a retired Programmer, systemseducator.    REVIEW OF SYSTEMS: Constitutional: No fevers, chills, or sweats, no generalized fatigue, change in appetite Eyes: No visual changes, double vision, eye pain Ear, nose and throat: No hearing loss, ear pain, nasal congestion, sore throat Cardiovascular: No chest pain, palpitations Respiratory:  No shortness of breath at rest or with exertion, wheezes GastrointestinaI: No nausea, vomiting, diarrhea, abdominal pain, fecal incontinence Genitourinary:  No dysuria, urinary retention or frequency Musculoskeletal:  No neck pain, back pain Integumentary: No rash, pruritus, skin lesions Neurological: as above Psychiatric: No depression, insomnia, anxiety Endocrine: No palpitations, fatigue, diaphoresis, mood swings, change in appetite, change in weight, increased thirst Hematologic/Lymphatic:  No purpura, petechiae. Allergic/Immunologic: no itchy/runny eyes, nasal congestion, recent allergic reactions, rashes  PHYSICAL EXAM: Vitals:   07/30/17 1601  BP: 124/76  Pulse: 87  SpO2: 96%   General: No acute distress.  Patient appears well-groomed.  Head:  Normocephalic/atraumatic Eyes:  fundi examined but not visualized Neck: supple, no paraspinal tenderness, full range of motion Back: No paraspinal tenderness Heart: regular rate and rhythm Lungs: Clear to auscultation bilaterally. Vascular: No carotid bruits. Neurological Exam: Mental status: alert and oriented to person, place, and time, recent and remote memory intact, fund of knowledge intact, attention and concentration intact, speech fluent and not dysarthric, language intact. Cranial nerves: CN I: not tested CN II: pupils equal, round and reactive to light, visual fields intact CN  III, IV, VI:  full range of motion, no nystagmus, no ptosis CN V: facial sensation intact CN VII: upper and lower face symmetric CN VIII: hearing intact CN IX, X: gag intact, uvula midline CN XI: sternocleidomastoid and trapezius muscles intact CN XII: tongue midline Bulk & Tone: normal, no fasciculations. Motor:  5/5 throughout  Sensation: temperature and vibration sensation intact. Deep Tendon Reflexes:  2+ throughout, toes downgoing.  Finger to nose testing:  Without dysmetria.  Heel to shin:  Without dysmetria.  Gait:  Normal station and stride.  Able to turn and tandem walk. Romberg negative.  IMPRESSION: 1.  Acute right cerebellar lacunar infarct, incidental finding.  Likely secondary to small vessel disease, possibly recent elevation in blood pressure.  It does not correlate with her visual symptoms. 2. Ocular migraines.  Her episodes are consistent with ocular migraines, despite no previous history of migraines. 3.  HTN, controlled.  PLAN: 1.  Continue aspirin 81mg  daily 2.  Start  atorvastatin 10mg  daily.  It may further be managed by Dr. [place] (LDL goal less than 70) 3.  Continue blood pressure control 4.  Continue Mediterranean diet 5.  If you wish to treat the ocular migraines, please contact me and we can start a preventative medication (such as topiramate).  Thank you for allowing me to take part in the care of this patient.  Shon Millet, DO  CC: Dr. [place]

## 2017-07-30 NOTE — Patient Instructions (Signed)
The visual auras are migraines.  The tiny stroke is an incidental finding. 1.  Continue aspirin 81mg  daily 2.  Start atorvastatin 10mg  daily. 3.  Continue blood pressure control 4.  Continue Mediterranean diet 5.  If you wish to treat the ocular migraines, please contact me and we can start a preventative medication (such as topiramate).

## 2017-08-16 ENCOUNTER — Other Ambulatory Visit: Payer: Self-pay | Admitting: Family Medicine

## 2017-08-16 DIAGNOSIS — I10 Essential (primary) hypertension: Secondary | ICD-10-CM

## 2017-08-23 ENCOUNTER — Other Ambulatory Visit: Payer: Self-pay | Admitting: Neurology

## 2017-09-11 ENCOUNTER — Other Ambulatory Visit: Payer: Self-pay | Admitting: Family Medicine

## 2017-09-14 DIAGNOSIS — I6381 Other cerebral infarction due to occlusion or stenosis of small artery: Secondary | ICD-10-CM | POA: Insufficient documentation

## 2017-09-14 NOTE — Progress Notes (Signed)
Danielle Trevino is a 72 y.o.female, who is here today with her husband to follow on HTN and anxiety.  Last seen on 07/17/17. Since her last OV she followed with neuro,Dr Jeffe due to acute lacunar infarct seen on brain MRI.   She was on HCTZ 25 mg daily and Lisinopril 10 mg daily, she is discontinued medications about 2 days ago because a few low BP's, feeling tiredness/ fatigue. Symptoms resolved after stopping medications.  Some BP's in the low 90's/60's. Since she discontinued medications BPs have been in the 120s/60s 70s.  Negative for headache, exertional chest pain, dyspnea,  focal weakness, or edema. She has had one episode of visual hours since her last visit.   Lab Results  Component Value Date   CREATININE 0.75 06/16/2017   BUN 16 06/16/2017   NA 141 06/16/2017   K 4.1 06/16/2017   CL 102 06/16/2017   CO2 28 06/16/2017    Anxiety: She started Valium 5 mg bid as needed. She has taken Valium about 1 or twice per week. She feels tired and fatigue when medication effect is fading off. Medication helps with anxiety.  + Crying spells. No suicidal thought or decreased enjoyment.  Problem was exacerbated by visiting father in law, who has serious health problems,including dementia. She stopped visiting.   Hyperlipidemia:  She did not start Lipitor 10 mg daily. Following a low fat diet: Yes.  Lab Results  Component Value Date   CHOL 196 02/28/2017   HDL 48.80 02/28/2017   LDLCALC 120 (H) 02/28/2017   TRIG 136.0 02/28/2017   CHOLHDL 4 02/28/2017     Review of Systems  Constitutional: Positive for fatigue. Negative for activity change, appetite change and fever.  HENT: Negative for mouth sores, nosebleeds and trouble swallowing.   Eyes: Negative for redness and visual disturbance.  Respiratory: Negative for cough, shortness of breath and wheezing.   Cardiovascular: Negative for chest pain, palpitations and leg swelling.  Gastrointestinal: Negative for  abdominal pain, nausea and vomiting.       Negative for changes in bowel habits.  Genitourinary: Negative for decreased urine volume, dysuria and hematuria.  Neurological: Negative for syncope, weakness, numbness and headaches.  Psychiatric/Behavioral: Negative for confusion. The patient is nervous/anxious.      Current Outpatient Medications on File Prior to Visit  Medication Sig Dispense Refill  . TURMERIC PO Take 1 tablet by mouth daily.     No current facility-administered medications on file prior to visit.      Past Medical History:  Diagnosis Date  . Hypertension     Allergies  Allergen Reactions  . Erythromycin     IV    Social History   Socioeconomic History  . Marital status: Married    Spouse name: Amada JupiterDale  . Number of children: Not on file  . Years of education: Not on file  . Highest education level: Bachelor's degree (e.g., BA, AB, BS)  Occupational History  . Not on file  Social Needs  . Financial resource strain: Not on file  . Food insecurity:    Worry: Not on file    Inability: Not on file  . Transportation needs:    Medical: Not on file    Non-medical: Not on file  Tobacco Use  . Smoking status: Former Smoker    Last attempt to quit: 05/19/2017    Years since quitting: 0.3  . Smokeless tobacco: Never Used  Substance and Sexual Activity  . Alcohol use:  No  . Drug use: No  . Sexual activity: Not on file  Lifestyle  . Physical activity:    Days per week: Not on file    Minutes per session: Not on file  . Stress: Not on file  Relationships  . Social connections:    Talks on phone: Not on file    Gets together: Not on file    Attends religious service: Not on file    Active member of club or organization: Not on file    Attends meetings of clubs or organizations: Not on file    Relationship status: Not on file  Other Topics Concern  . Not on file  Social History Narrative   Patient is right-handed. She lives with her husband in a one  story house. She avoids caffeine. She does not exercise. She is a retired Programmer, systems.    Vitals:   09/15/17 1412  BP: 110/66  Pulse: 73  Resp: 12  Temp: 97.8 F (36.6 C)  SpO2: 97%   Body mass index is 28.44 kg/m.   Physical Exam  Nursing note and vitals reviewed. Constitutional: She is oriented to person, place, and time. She appears well-developed. No distress.  HENT:  Head: Normocephalic and atraumatic.  Mouth/Throat: Oropharynx is clear and moist and mucous membranes are normal.  Eyes: Pupils are equal, round, and reactive to light. Conjunctivae are normal.  Cardiovascular: Normal rate and regular rhythm.  No murmur heard. Pulses:      Dorsalis pedis pulses are 2+ on the right side, and 2+ on the left side.  Respiratory: Effort normal and breath sounds normal. No respiratory distress.  GI: Soft. She exhibits no mass. There is no hepatomegaly. There is no tenderness.  Musculoskeletal: She exhibits no edema.  Lymphadenopathy:    She has no cervical adenopathy.  Neurological: She is alert and oriented to person, place, and time. She has normal strength. Gait normal.  Skin: Skin is warm. No erythema.  Psychiatric: She has a normal mood and affect.  Well groomed, good eye contact.    ASSESSMENT AND PLAN:   [name] was seen today for follow-up.  Diagnoses and all orders for this visit:  Hyperlipidemia, unspecified hyperlipidemia type  She would like to continue nonpharmacologic treatment. She is not interested in starting Lipitor. She understands benefits of statin medication. LDL goal < 100, ideally < 70. Follow-up in 4 months.  Hypertension, essential, benign Adequately controlled on nonpharmacologic treatment. For now she will continue monitoring BP and if it starts going up she will start lisinopril 5 mg daily. Continue low-salt diet.  Eye exam is current.  F/U in 4 months, before if needed.   Lacunar infarction (HCC) Without significant residual  effect. She will continue Aspirin 81 mg daily. We discussed benefits of statin medication, encouraged to try Lipitor 10 mg daily.   Anxiety disorder, unspecified For now she is not interested in daily medication, SSRI. She would like to continue Valium 5 mg daily as needed. Follow-up in 4 months, before if needed. We discussed some side effects of benzodiazepines.    Levern Kalka G. [place], MD  [facility]. [place] office.

## 2017-09-15 ENCOUNTER — Ambulatory Visit: Payer: Medicare Other | Admitting: Family Medicine

## 2017-09-15 ENCOUNTER — Encounter: Payer: Self-pay | Admitting: Family Medicine

## 2017-09-15 VITALS — BP 110/66 | HR 73 | Temp 97.8°F | Resp 12 | Ht 62.0 in | Wt 155.5 lb

## 2017-09-15 DIAGNOSIS — I10 Essential (primary) hypertension: Secondary | ICD-10-CM

## 2017-09-15 DIAGNOSIS — E785 Hyperlipidemia, unspecified: Secondary | ICD-10-CM

## 2017-09-15 DIAGNOSIS — I6381 Other cerebral infarction due to occlusion or stenosis of small artery: Secondary | ICD-10-CM

## 2017-09-15 DIAGNOSIS — F419 Anxiety disorder, unspecified: Secondary | ICD-10-CM | POA: Diagnosis not present

## 2017-09-15 MED ORDER — DIAZEPAM 5 MG PO TABS: 2.5000 mg | ORAL_TABLET | Freq: Every day | ORAL | 0 refills | Status: DC | PRN

## 2017-09-15 MED ORDER — LISINOPRIL 5 MG PO TABS: 5.0000 mg | ORAL_TABLET | Freq: Every day | ORAL | 3 refills | Status: DC

## 2017-09-15 NOTE — Patient Instructions (Addendum)
A few things to remember from today's visit:   Hyperlipidemia, unspecified hyperlipidemia type  Hypertension, essential, benign - Plan: lisinopril (PRINIVIL,ZESTRIL) 5 MG tablet  Lacunar infarction (HCC)  Anxiety disorder, unspecified type - Plan: diazepam (VALIUM) 5 MG tablet  Continue monitoring blood pressure and if start going up start Lisinopril 5 mg.   Please be sure medication list is accurate. If a new problem present, please set up appointment sooner than planned today.

## 2017-09-15 NOTE — Assessment & Plan Note (Signed)
Without significant residual effect. She will continue Aspirin 81 mg daily. We discussed benefits of statin medication, encouraged to try Lipitor 10 mg daily.

## 2017-09-15 NOTE — Assessment & Plan Note (Signed)
For now she is not interested in daily medication, SSRI. She would like to continue Valium 5 mg daily as needed. Follow-up in 4 months, before if needed.

## 2017-09-15 NOTE — Assessment & Plan Note (Signed)
Adequately controlled on nonpharmacologic treatment. For now she will continue monitoring BP and if it starts going up she will start lisinopril 5 mg daily. Continue low-salt diet.  Eye exam is current.  F/U in 4 months, before if needed.

## 2017-09-18 ENCOUNTER — Encounter: Payer: Self-pay | Admitting: Family Medicine

## 2017-09-22 ENCOUNTER — Encounter: Payer: Self-pay | Admitting: Family Medicine

## 2017-09-22 ENCOUNTER — Ambulatory Visit: Payer: Medicare Other | Admitting: Family Medicine

## 2017-09-22 VITALS — BP 140/74 | HR 70 | Temp 98.5°F | Resp 12 | Ht 62.0 in | Wt 158.4 lb

## 2017-09-22 DIAGNOSIS — R600 Localized edema: Secondary | ICD-10-CM | POA: Diagnosis not present

## 2017-09-22 DIAGNOSIS — F419 Anxiety disorder, unspecified: Secondary | ICD-10-CM

## 2017-09-22 DIAGNOSIS — I10 Essential (primary) hypertension: Secondary | ICD-10-CM | POA: Diagnosis not present

## 2017-09-22 MED ORDER — HYDROCHLOROTHIAZIDE 12.5 MG PO TABS: 12.5000 mg | ORAL_TABLET | Freq: Every day | ORAL | 0 refills | Status: DC

## 2017-09-22 NOTE — Patient Instructions (Addendum)
A few things to remember from today's visit:   Hypertension, essential, benign - Plan: Basic metabolic panel, hydrochlorothiazide (HYDRODIURIL) 12.5 MG tablet  Bilateral lower extremity edema - Plan: Basic metabolic panel  Hydrochlorothiazide 12.5 mg added today. Continue lisinopril 5 mg daily. If BP still elevated, above 140/90, you can increase lisinopril from 5 mg to 10 mg.  I will see you back in 4 weeks, before if needed.   Please be sure medication list is accurate. If a new problem present, please set up appointment sooner than planned today.

## 2017-09-22 NOTE — Assessment & Plan Note (Signed)
SBP slightly elevated today. HCTZ 12.5 mg added. She will continue on lisinopril 5 mg daily. If BP still elevated in 7 to 10 days, she can increase lisinopril to 10 mg. Continue low-salt diet. Instructed about warning signs. Follow-up in 4 weeks.

## 2017-09-22 NOTE — Assessment & Plan Note (Signed)
This problem may be playing a role on elevated BP. For now she will continue Valium 5 mg daily as needed. If problem continues we may want to consider a daily SSRI medication.

## 2017-09-22 NOTE — Progress Notes (Signed)
ACUTE VISIT   HPI:  Chief Complaint  Patient presents with  . Hypertension    started last week since medication changes    Danielle Trevino is a 72 y.o. female, who is here today with her husband concerned about elevated BP.   I saw her last on 09/15/2017, when she was reporting episodes of dizziness and low BP readings.  So she discontinued lisinopril 10 mg and HCTZ 25 mg. As visit I recommended continuing monitoring BP and resuming lisinopril 5 mg if BP started going up. She resumed lisinopril at 5 mg dose a few days ago, BP has been better but still having some episodes of elevated BP. Max BP 160-170/80-90s.  Hx of CVD.   BP is exacerbated by stress. BP seems to improve after she takes her Valium 5 mg. Since her last visit she has not had crying spells. Negative for suicidal thoughts.  She has had a mild dull parietal headache, no associated nausea, vomiting, visual changes, or focal deficit.    Lab Results  Component Value Date   CREATININE 0.75 06/16/2017   BUN 16 06/16/2017   NA 141 06/16/2017   K 4.1 06/16/2017   CL 102 06/16/2017   CO2 28 06/16/2017    She is also concerned about peri-ankle edema, intermittently. Edema seems to be exacerbated by prolonged walking/standing, it is worse at the end of the day. Edema is not present when she gets up first thing in the morning. Negative for decreased urine output, forming the urine, gross hematuria, orthopnea, or PND. She has not tried OTC medication. She has history of varicose veins.   Review of Systems  Constitutional: Negative for activity change, appetite change, fatigue and fever.  HENT: Negative for mouth sores, nosebleeds and trouble swallowing.   Eyes: Negative for redness and visual disturbance.  Respiratory: Negative for cough, shortness of breath and wheezing.   Cardiovascular: Positive for leg swelling. Negative for chest pain and palpitations.  Gastrointestinal: Negative for abdominal  pain, nausea and vomiting.       Negative for changes in bowel habits.  Genitourinary: Negative for decreased urine volume, dysuria and hematuria.  Musculoskeletal: Negative for gait problem and myalgias.  Neurological: Negative for syncope, weakness, numbness and headaches.  Psychiatric/Behavioral: Negative for confusion. The patient is nervous/anxious.       Current Outpatient Medications on File Prior to Visit  Medication Sig Dispense Refill  . diazepam (VALIUM) 5 MG tablet Take 0.5-1 tablets (2.5-5 mg total) by mouth daily as needed for anxiety. 20 tablet 0  . lisinopril (PRINIVIL,ZESTRIL) 5 MG tablet Take 1 tablet (5 mg total) by mouth daily. 90 tablet 3  . TURMERIC PO Take 1 tablet by mouth daily.     No current facility-administered medications on file prior to visit.      Past Medical History:  Diagnosis Date  . Hypertension    Allergies  Allergen Reactions  . Erythromycin     IV    Social History   Socioeconomic History  . Marital status: Married    Spouse name: Amada Jupiter  . Number of children: Not on file  . Years of education: Not on file  . Highest education level: Bachelor's degree (e.g., BA, AB, BS)  Occupational History  . Not on file  Social Needs  . Financial resource strain: Not on file  . Food insecurity:    Worry: Not on file    Inability: Not on file  . Transportation needs:  Medical: Not on file    Non-medical: Not on file  Tobacco Use  . Smoking status: Former Smoker    Last attempt to quit: 05/19/2017    Years since quitting: 0.3  . Smokeless tobacco: Never Used  Substance and Sexual Activity  . Alcohol use: No  . Drug use: No  . Sexual activity: Not on file  Lifestyle  . Physical activity:    Days per week: Not on file    Minutes per session: Not on file  . Stress: Not on file  Relationships  . Social connections:    Talks on phone: Not on file    Gets together: Not on file    Attends religious service: Not on file    Active  member of club or organization: Not on file    Attends meetings of clubs or organizations: Not on file    Relationship status: Not on file  Other Topics Concern  . Not on file  Social History Narrative   Patient is right-handed. She lives with her husband in a one story house. She avoids caffeine. She does not exercise. She is a retired Programmer, systemseducator.    Vitals:   09/22/17 1555  BP: 140/74  Pulse: 70  Resp: 12  Temp: 98.5 F (36.9 C)  SpO2: 97%   Body mass index is 28.97 kg/m.  Physical Exam  Nursing note and vitals reviewed. Constitutional: She is oriented to person, place, and time. She appears well-developed. No distress.  HENT:  Head: Normocephalic and atraumatic.  Mouth/Throat: Oropharynx is clear and moist and mucous membranes are normal.  Eyes: Pupils are equal, round, and reactive to light. Conjunctivae are normal.  Cardiovascular: Normal rate and regular rhythm.  No murmur heard. Pulses:      Dorsalis pedis pulses are 2+ on the right side, and 2+ on the left side.  Varicose veins LE, bilateral.  Respiratory: Effort normal and breath sounds normal. No respiratory distress.  GI: Soft. She exhibits no mass. There is no hepatomegaly. There is no tenderness.  Musculoskeletal: She exhibits no edema.  Lymphadenopathy:    She has no cervical adenopathy.  Neurological: She is alert and oriented to person, place, and time. She has normal strength. No cranial nerve deficit. Gait normal.  Skin: Skin is warm. No rash noted. No erythema.  Psychiatric: Her mood appears anxious.  Well groomed, good eye contact.     ASSESSMENT AND PLAN:   [name] was seen today for hypertension.  Orders Placed This Encounter  Procedures  . Basic metabolic panel    Lab Results  Component Value Date   CREATININE 0.74 09/22/2017   BUN 8 09/22/2017   NA 143 09/22/2017   K 4.3 09/22/2017   CL 104 09/22/2017   CO2 30 09/22/2017    Bilateral lower extremity edema  Today I do not  appreciate lower extremity edema. We discussed possible etiologies, including vein disease and medications. I think it is related to varicose veins. Compression stockings on/or lower extremity elevation may help.  -     Basic metabolic panel   Anxiety disorder, unspecified This problem may be playing a role on elevated BP. For now she will continue Valium 5 mg daily as needed. If problem continues we may want to consider a daily SSRI medication.  Hypertension, essential, benign SBP slightly elevated today. HCTZ 12.5 mg added. She will continue on lisinopril 5 mg daily. If BP still elevated in 7 to 10 days, she can increase lisinopril to  10 mg. Continue low-salt diet. Instructed about warning signs. Follow-up in 4 weeks.     Return in about 1 month (around 10/20/2017) for HTN.       Tiffanny Lamarche G. [place], MD  [facility]. [place] office.

## 2017-09-23 ENCOUNTER — Encounter: Payer: Self-pay | Admitting: Family Medicine

## 2017-09-23 LAB — BASIC METABOLIC PANEL
BUN: 8 mg/dL (ref 6–23)
CO2: 30 meq/L (ref 19–32)
CREATININE: 0.74 mg/dL (ref 0.40–1.20)
Calcium: 9.5 mg/dL (ref 8.4–10.5)
Chloride: 104 mEq/L (ref 96–112)
GFR: 81.76 mL/min (ref >60.00)
GLUCOSE: 86 mg/dL (ref 70–99)
Potassium: 4.3 mEq/L (ref 3.5–5.1)
Sodium: 143 mEq/L (ref 135–145)

## 2017-09-29 ENCOUNTER — Encounter: Payer: Self-pay | Admitting: Family Medicine

## 2017-09-29 ENCOUNTER — Other Ambulatory Visit: Payer: Self-pay | Admitting: Family Medicine

## 2017-09-29 MED ORDER — METOPROLOL SUCCINATE ER 25 MG PO TB24: ORAL_TABLET | ORAL | 3 refills | Status: DC

## 2017-09-30 ENCOUNTER — Encounter: Payer: Self-pay | Admitting: Family Medicine

## 2017-09-30 ENCOUNTER — Telehealth: Payer: Self-pay | Admitting: Family Medicine

## 2017-09-30 NOTE — Telephone Encounter (Signed)
Spoke with patient, gave instructions per Dr. Jordan. Patient verbalized understanding. 

## 2017-09-30 NOTE — Telephone Encounter (Signed)
Copied from CRM [phone]#[id number]. Topic: Inquiry >> Sep 30, 2017 10:48 AM Yvonna Alanisobinson, Andra M wrote: Reason for CRM: Patient and her husband called wanting clarification of the guidelines for taking the medication metoprolol succinate (TOPROL-XL) 25 MG 24 hr tablet. They want to know if she should take this medication in the morning or at night.        Thank You!!!

## 2017-09-30 NOTE — Telephone Encounter (Signed)
Metoprolol succinate can be taken in the morning or night, and the last 24 hours. Because BP is fluctuating, I also recommended an extra tablet of metoprolol succinate 25 mg if BP above 150/90.  Thanks, BJ

## 2017-09-30 NOTE — Telephone Encounter (Signed)
Message sent to Dr. Jordan. Please advise! 

## 2017-10-01 ENCOUNTER — Encounter: Payer: Self-pay | Admitting: Family Medicine

## 2017-10-01 ENCOUNTER — Ambulatory Visit: Payer: Medicare Other | Admitting: Family Medicine

## 2017-10-01 VITALS — BP 130/84 | HR 61 | Temp 98.1°F | Resp 12 | Ht 62.0 in | Wt 154.4 lb

## 2017-10-01 DIAGNOSIS — F419 Anxiety disorder, unspecified: Secondary | ICD-10-CM | POA: Diagnosis not present

## 2017-10-01 DIAGNOSIS — I10 Essential (primary) hypertension: Secondary | ICD-10-CM | POA: Diagnosis not present

## 2017-10-01 MED ORDER — DIAZEPAM 5 MG PO TABS: 2.5000 mg | ORAL_TABLET | Freq: Two times a day (BID) | ORAL | 0 refills | Status: DC | PRN

## 2017-10-01 NOTE — Assessment & Plan Note (Addendum)
This seems to be exacerbating hypertension. For now she is not interested in daily SSRI. We will increase dose of Valium from once daily to twice daily as needed. We discussed some side effects of benzodiazepines in general. Follow-up in 3 months if symptoms seem to be stable, sooner if needed.

## 2017-10-01 NOTE — Progress Notes (Signed)
HPI:   Danielle Trevino is a 72 y.o. female, who is here today with her husband to follow on recent OV and ER visit.   She was seen here in the office on 09/22/2017 to follow on hypertension.  She [facility]. BP was elevated at 222/107. No associated headache, visual changes, chest pain, palpitation, dyspnea, or MS changes. She states that she had stopped HCTZ for 2 weeks because BP was well controlled.  She was still taking lisinopril 5 mg daily.  BP at the time of discharge was 165/71.  She is back to daily HCTZ 12.5 mg and taking lisinopril 5 mg.  She wonders if medications need to be increased.  A few days ago I recommended taking Toprol succinate 25 mg daily because reporting elevated BP, she did not tolerate medication well.  She had headache, nausea, and felt lethargic when she took medication.  The symptoms resolved after stopping metoprolol.   No associated tachycardia, diaphoresis, or flushing sensation.  Basic Metabolic Panel (09/28/2017 4:51 PM EDT).   Basic Metabolic Panel (09/28/2017 4:51 PM EDT)  Component Value Ref Range Performed At Pathologist Signature  Na 144 136 - 146 mmol/L [facility]   Potassium 3.7 3.7 - 5.4 mmol/L [facility]   Cl 106 97 - 108 mmol/L [facility]   CO2 23 20 - 32 mmol/L [facility]   Glucose 115 (H) 65 - 99 mg/dL [facility]   BUN 7 (L) 8 - 27 mg/dL [facility]   Creatinine 0.58 0.57 - 1.00 mg/dL [facility]   Ca 9.1 8.6 - 10.2 mg/dL [facility]   BUN/CREAT RATIO 12.1 11.0 - 26.0 [facility]   GFR AFRICAN AMERICAN 106 Comment:  African-American:  Normal GFR (glomerular filtration rate) > 60 mL/min/1.73 meters squared. < 60 may include impaired kidney function based on creatinine, age, legal sex, and race  normalized to accepted average body surface area  mL/min/1.673m2     Stress and anxiety seem to trigger elevation of BP. Recently a 73 years old friend died, this caused  some anxiety.  She is currently on Valium 5 mg but states that she is trying not to take it. In the ER she received Valium 5 mg and BP seemed to improve. No depressed mood or suicidal thoughts.  Review of Systems  Constitutional: Negative for activity change, appetite change, fatigue and fever.  HENT: Negative for mouth sores and nosebleeds.   Eyes: Negative for redness and visual disturbance.  Respiratory: Negative for cough, shortness of breath and wheezing.   Cardiovascular: Negative for chest pain, palpitations and leg swelling.  Gastrointestinal: Negative for abdominal pain, nausea and vomiting.       Negative for changes in bowel habits.  Genitourinary: Negative for decreased urine volume and hematuria.  Neurological: Negative for syncope, weakness and headaches.  Psychiatric/Behavioral: Negative for confusion. The patient is nervous/anxious.       Current Outpatient Medications on File Prior to Visit  Medication Sig Dispense Refill  . hydrochlorothiazide (HYDRODIURIL) 12.5 MG tablet Take 1 tablet (12.5 mg total) by mouth daily. 90 tablet 0  . lisinopril (PRINIVIL,ZESTRIL) 5 MG tablet Take 1 tablet (5 mg total) by mouth daily. 90 tablet 3  . TURMERIC PO Take 1 tablet by mouth daily.    . metoprolol succinate (TOPROL-XL) 25 MG 24 hr tablet 1 tablet daily p.o., she  can take an extra tablet as needed if BP > 150/90/ (Patient not taking: Reported on 10/01/2017) 60 tablet 3   No current facility-administered medications on file prior to visit.      Past Medical History:  Diagnosis Date  . Hypertension    Allergies  Allergen Reactions  . Erythromycin     IV    Social History   Socioeconomic History  . Marital status: Married    Spouse name: Danielle Trevino  . Number of children: Not on file  . Years of  education: Not on file  . Highest education level: Bachelor's degree (e.g., BA, AB, BS)  Occupational History  . Not on file  Social Needs  . Financial resource strain: Not on file  . Food insecurity:    Worry: Not on file    Inability: Not on file  . Transportation needs:    Medical: Not on file    Non-medical: Not on file  Tobacco Use  . Smoking status: Former Smoker    Last attempt to quit: 05/19/2017    Years since quitting: 0.3  . Smokeless tobacco: Never Used  Substance and Sexual Activity  . Alcohol use: No  . Drug use: No  . Sexual activity: Not on file  Lifestyle  . Physical activity:    Days per week: Not on file    Minutes per session: Not on file  . Stress: Not on file  Relationships  . Social connections:    Talks on phone: Not on file    Gets together: Not on file    Attends religious service: Not on file    Active member of club or organization: Not on file    Attends meetings of clubs or organizations: Not on file    Relationship status: Not on file  Other Topics Concern  . Not on file  Social History Narrative   Patient is right-handed. She lives with her husband in a one story house. She avoids caffeine. She does not exercise. She is a retired Programmer, systems.    Vitals:   10/01/17 0933  BP: 130/84  Pulse: 61  Resp: 12  Temp: 98.1 F (36.7 C)  SpO2: 98%   Body mass index is 28.24 kg/m.   Physical Exam  Nursing note and vitals reviewed. Constitutional: She is oriented to person, place, and time. She appears well-developed. No distress.  HENT:  Head: Normocephalic and atraumatic.  Mouth/Throat: Oropharynx is clear and moist and mucous membranes are normal.  Eyes: Pupils are equal, round, and reactive to light. Conjunctivae are normal.  Cardiovascular: Normal rate and regular rhythm.  No murmur heard. Pulses:      Dorsalis pedis pulses are 2+ on the right side, and 2+ on the left side.  Respiratory: Effort normal and breath sounds normal. No  respiratory distress.  GI: Soft. She exhibits no mass. There is no hepatomegaly. There is no tenderness.  Musculoskeletal: She exhibits no edema.  Lymphadenopathy:    She has no cervical adenopathy.  Neurological: She is alert and oriented to person, place, and time. She has normal strength. No cranial nerve deficit. Gait normal.  Reflex Scores:      Bicep reflexes are 2+ on the right side and 2+ on the left side.      Patellar reflexes are 2+ on the right side and 2+ on the left side. Skin: Skin is warm. No erythema.  Psychiatric: Her mood appears anxious.  Well groomed, good eye contact.    ASSESSMENT AND  PLAN:  Danielle Trevino was seen today for discuss b/p medications.  Diagnoses and all orders for this visit:  Hypertension, essential, benign Today BP is adequately controlled. For now I recommend continuing lisinopril 5 mg and HCTZ 12.5 mg daily.  He is stressed the importance of compliance with medication.  If BP regularly starts going up, she can increase lisinopril from 5 mg to 10 mg. Continue monitoring BP daily. Instructed about warning signs. She has an appointment in 10/2017, if BP is well controlled she can move her appointment for 3 months follow-up.  Anxiety disorder, unspecified This seems to be exacerbating hypertension. For now she is not interested in daily SSRI. We will increase dose of Valium from once daily to twice daily as needed. We discussed some side effects of benzodiazepines in general. Follow-up in 3 months if symptoms seem to be stable, sooner if needed.      Betty G. [place], MD  [facility]. [place] office.

## 2017-10-01 NOTE — Patient Instructions (Addendum)
A few things to remember from today's visit:   No diagnosis found.  No changes in Lisinopril or HCTZ. Valium 2 times per day as needed.   Please be sure medication list is accurate. If a new problem present, please set up appointment sooner than planned today.

## 2017-10-01 NOTE — Assessment & Plan Note (Addendum)
Today BP is adequately controlled. For now I recommend continuing lisinopril 5 mg and HCTZ 12.5 mg daily.  He is stressed the importance of compliance with medication.  If BP regularly starts going up, she can increase lisinopril from 5 mg to 10 mg. Continue monitoring BP daily. Instructed about warning signs. She has an appointment in 10/2017, if BP is well controlled she can move her appointment for 3 months follow-up.

## 2017-10-06 ENCOUNTER — Encounter: Payer: Self-pay | Admitting: Family Medicine

## 2017-10-21 ENCOUNTER — Encounter: Payer: Self-pay | Admitting: Family Medicine

## 2017-10-21 ENCOUNTER — Ambulatory Visit: Payer: Medicare Other | Admitting: Family Medicine

## 2017-10-21 VITALS — BP 124/70 | HR 77 | Temp 98.4°F | Resp 12 | Ht 62.0 in | Wt 160.2 lb

## 2017-10-21 DIAGNOSIS — E663 Overweight: Secondary | ICD-10-CM

## 2017-10-21 DIAGNOSIS — K5900 Constipation, unspecified: Secondary | ICD-10-CM | POA: Diagnosis not present

## 2017-10-21 DIAGNOSIS — I10 Essential (primary) hypertension: Secondary | ICD-10-CM | POA: Diagnosis not present

## 2017-10-21 DIAGNOSIS — F419 Anxiety disorder, unspecified: Secondary | ICD-10-CM | POA: Diagnosis not present

## 2017-10-21 LAB — TSH: TSH: 0.49 u[IU]/mL (ref 0.35–4.50)

## 2017-10-21 NOTE — Progress Notes (Signed)
Ms. CONNI BROUILLARD is a 72 y.o.female, who is here today with her husband to follow on HTN.  Currently on lisinopril 10 mg and HCTZ 12.5 mg daily. She is taking medications as instructed, no side effects reported.  She has not noted headache, visual changes, exertional chest pain, dyspnea,  focal weakness, or edema.    Lab Results  Component Value Date   CREATININE 0.74 09/22/2017   BUN 8 09/22/2017   NA 143 09/22/2017   K 4.3 09/22/2017   CL 104 09/22/2017   CO2 30 09/22/2017   She brings BP readings, checking BP 4-5 times per day. BPs fluctuate between low 110s/70s to 150s/90s,a SBP 162. Elevation of BP seems to be aggravated by stress.  She is still taking Valium for acute anxiety as needed.  Negative for suicidal thoughts,she has had some crying episodes. She is not interested in trying daily SSRI.  She also mentions constipation for the past 2 days, which is very unusual for her.  Denies abdominal pain, nausea, vomiting, blood in stool or melena.  She is also concerned about 5 Lb wt gain. She has not changed her diet significant,has had some ice cream. Clothes is fitting the same. She is not exercising regularly.  Review of Systems  Constitutional: Negative for activity change, appetite change, fatigue and fever.  HENT: Negative for nosebleeds and sore throat.   Eyes: Negative for pain and visual disturbance.  Respiratory: Negative for cough, shortness of breath and wheezing.   Cardiovascular: Negative for chest pain, palpitations and leg swelling.  Gastrointestinal: Positive for constipation. Negative for abdominal pain, nausea and vomiting.  Genitourinary: Negative for decreased urine volume and hematuria.  Neurological: Negative for syncope, weakness and headaches.  Psychiatric/Behavioral: The patient is nervous/anxious.      Current Outpatient Medications on File Prior to Visit  Medication Sig Dispense Refill  . aspirin EC 81 MG tablet Take 81 mg by  mouth daily.    . diazepam (VALIUM) 5 MG tablet Take 0.5-1 tablets (2.5-5 mg total) by mouth every 12 (twelve) hours as needed for anxiety. 20 tablet 0  . hydrochlorothiazide (HYDRODIURIL) 12.5 MG tablet Take 1 tablet (12.5 mg total) by mouth daily. 90 tablet 0  . lisinopril (PRINIVIL,ZESTRIL) 10 MG tablet Take 10 mg by mouth daily.    . TURMERIC PO Take 1 tablet by mouth daily.     No current facility-administered medications on file prior to visit.      Past Medical History:  Diagnosis Date  . Hypertension     Allergies  Allergen Reactions  . Erythromycin     IV    Social History   Socioeconomic History  . Marital status: Married    Spouse name: Amada Jupiter  . Number of children: Not on file  . Years of education: Not on file  . Highest education level: Bachelor's degree (e.g., BA, AB, BS)  Occupational History  . Not on file  Social Needs  . Financial resource strain: Not on file  . Food insecurity:    Worry: Not on file    Inability: Not on file  . Transportation needs:    Medical: Not on file    Non-medical: Not on file  Tobacco Use  . Smoking status: Former Smoker    Last attempt to quit: 05/19/2017    Years since quitting: 0.4  . Smokeless tobacco: Never Used  Substance and Sexual Activity  . Alcohol use: No  . Drug use: No  . Sexual  activity: Not on file  Lifestyle  . Physical activity:    Days per week: Not on file    Minutes per session: Not on file  . Stress: Not on file  Relationships  . Social connections:    Talks on phone: Not on file    Gets together: Not on file    Attends religious service: Not on file    Active member of club or organization: Not on file    Attends meetings of clubs or organizations: Not on file    Relationship status: Not on file  Other Topics Concern  . Not on file  Social History Narrative   Patient is right-handed. She lives with her husband in a one story house. She avoids caffeine. She does not exercise. She is a retired  Programmer, systems.    Vitals:   10/21/17 1345  BP: 124/70  Pulse: 77  Resp: 12  Temp: 98.4 F (36.9 C)  SpO2: 96%   Body mass index is 29.31 kg/m.  Wt Readings from Last 3 Encounters:  10/21/17 160 lb 4 oz (72.7 kg)  10/01/17 154 lb 6 oz (70 kg)  09/22/17 158 lb 6 oz (71.8 kg)     Physical Exam  Nursing note and vitals reviewed. Constitutional: She is oriented to person, place, and time. She appears well-developed. No distress.  HENT:  Head: Normocephalic and atraumatic.  Mouth/Throat: Oropharynx is clear and moist. Mucous membranes are dry (mild.).  Eyes: Pupils are equal, round, and reactive to light. Conjunctivae are normal.  Cardiovascular: Normal rate and regular rhythm.  No murmur heard. Pulses:      Dorsalis pedis pulses are 2+ on the right side, and 2+ on the left side.  Respiratory: Effort normal and breath sounds normal. No respiratory distress.  Musculoskeletal: She exhibits no edema.  Lymphadenopathy:    She has no cervical adenopathy.  Neurological: She is alert and oriented to person, place, and time. She has normal strength. No cranial nerve deficit. Gait normal.  Skin: Skin is warm. No rash noted. No erythema.  Psychiatric: Her mood appears anxious.  Well groomed, good eye contact.    ASSESSMENT AND PLAN:   Ms. Aaira was seen today for follow-up.  Diagnoses and all orders for this visit:  Hypertension, essential, benign  Better controlled, some elevated numbers at home,usually associated to stress. She has not tolerated higher doses of antihypertensives. Recommend taking BP once daily and repeat later if BP 150/90 or higher. Continue low salt diet.  -     TSH  Anxiety disorder, unspecified type  No changes in valium,side effects discussed. She is not interested in SSRI.  Constipation, unspecified constipation type  Increased fiber and fluid intake. OTC Miralax daily if needed. Instructed about warning signs.  -     TSH  Overweight (BMI  25.0-29.9)  Gained about 6 Lb since her last OV, 10/01/17. Recommend limiting ice cream intake. She thinks it may be "fluid retention" or side effect of Lisinopril. Side effects of meds discussed.     Betty G. [place], MD  [facility]. [place] office.

## 2017-10-21 NOTE — Patient Instructions (Signed)
A few things to remember from today's visit:   Hypertension, essential, benign - Plan: TSH  Anxiety disorder, unspecified type  Constipation, unspecified constipation type - Plan: TSH  Increase fiber and fluid intake.  No changes in lisinopril or hydrochlorothiazide dose for now. Check your blood pressure once daily and check it again if blood pressure 150/90 or higher.  Please be sure medication list is accurate. If a new problem present, please set up appointment sooner than planned today.

## 2017-10-22 ENCOUNTER — Encounter: Payer: Self-pay | Admitting: Family Medicine

## 2017-10-23 ENCOUNTER — Encounter: Payer: Self-pay | Admitting: Family Medicine

## 2017-11-10 ENCOUNTER — Telehealth: Payer: Self-pay | Admitting: Family Medicine

## 2017-11-10 MED ORDER — LISINOPRIL 10 MG PO TABS: 10.0000 mg | ORAL_TABLET | Freq: Every day | ORAL | 1 refills | Status: DC

## 2017-11-10 NOTE — Telephone Encounter (Signed)
Copied from CRM [phone]. Topic: Quick Communication - Rx Refill/Question >> Nov 10, 2017  1:07 PM Jens SomMedley, Jennifer A wrote: Medication:lisinopril (PRINIVIL,ZESTRIL) 10 MG tablet [[id number]][[id number]]   Has the patient contacted their pharmacy? Yes  (Agent: If no, request that the patient contact the pharmacy for the refill.) (Agent: If yes, when and what did the pharmacy advise?)  Preferred Pharmacy (with phone number or street name): [id number] [place] - [address] [place] [address] Phone: [phone] Fax: [phone]    Agent: Please be advised that RX refills may take up to 3 business days. We ask that you follow-up with your pharmacy.

## 2017-12-22 ENCOUNTER — Other Ambulatory Visit: Payer: Self-pay | Admitting: Family Medicine

## 2017-12-22 DIAGNOSIS — I10 Essential (primary) hypertension: Secondary | ICD-10-CM

## 2017-12-22 DIAGNOSIS — F419 Anxiety disorder, unspecified: Secondary | ICD-10-CM

## 2017-12-23 ENCOUNTER — Other Ambulatory Visit: Payer: Self-pay | Admitting: Family Medicine

## 2017-12-23 DIAGNOSIS — F419 Anxiety disorder, unspecified: Secondary | ICD-10-CM

## 2017-12-23 MED ORDER — DIAZEPAM 5 MG PO TABS: 2.5000 mg | ORAL_TABLET | Freq: Two times a day (BID) | ORAL | 1 refills | Status: DC | PRN

## 2017-12-23 MED ORDER — HYDROCHLOROTHIAZIDE 12.5 MG PO TABS: 12.5000 mg | ORAL_TABLET | Freq: Every day | ORAL | 0 refills | Status: DC

## 2018-01-06 ENCOUNTER — Ambulatory Visit: Payer: Medicare Other | Admitting: Family Medicine

## 2018-01-06 ENCOUNTER — Encounter: Payer: Self-pay | Admitting: Family Medicine

## 2018-01-06 VITALS — BP 145/80 | HR 75 | Temp 98.4°F | Resp 12 | Ht 62.0 in | Wt 167.2 lb

## 2018-01-06 DIAGNOSIS — I6381 Other cerebral infarction due to occlusion or stenosis of small artery: Secondary | ICD-10-CM | POA: Diagnosis not present

## 2018-01-06 DIAGNOSIS — F419 Anxiety disorder, unspecified: Secondary | ICD-10-CM | POA: Diagnosis not present

## 2018-01-06 DIAGNOSIS — I10 Essential (primary) hypertension: Secondary | ICD-10-CM | POA: Diagnosis not present

## 2018-01-06 MED ORDER — LISINOPRIL 10 MG PO TABS: 20.0000 mg | ORAL_TABLET | Freq: Every day | ORAL | 1 refills | Status: DC

## 2018-01-06 NOTE — Assessment & Plan Note (Signed)
She is very concerned about this problem, afraid of having another episode of CVA. She is currently on Aspirin 81 mg daily. She is not interested in starting medication, she is afraid of side effects. Adequate BP control.

## 2018-01-06 NOTE — Assessment & Plan Note (Signed)
Problem is not well controlled. She will continue Valium 5 mg daily as needed. She is not interested in taking daily SSRI, recommend Lexapro 5 mg.  She is afraid of possible side effects. She will let me know if she decides to take Lexapro.

## 2018-01-06 NOTE — Patient Instructions (Addendum)
A few things to remember from today's visit:   Hypertension, essential, benign - Plan: lisinopril (PRINIVIL,ZESTRIL) 10 MG tablet  Anxiety disorder, unspecified type  Lacunar infarction [facility](HCC)  Please let me know if you decide to try Lexapro 5 mg daily. No changes in Valium. We discontinued hydrochlorothiazide. Take lisinopril 10 mg in the morning and take another tablet at night if BP above 140/90. Please be sure medication list is accurate. If a new problem present, please set up appointment sooner than planned today.

## 2018-01-06 NOTE — Progress Notes (Signed)
Danielle Trevino is a 72 y.o.female, who is here today with her husband concerned about elevated BPs. Currently she is on lisinopril 10 mg and HCTZ 12.5 mg daily. For the past 1 to 2 months she has had BPs in the 140s to 170/50s to 80s.  Elevated BP seems to be aggravated by stress.   She is taking medications as instructed, no side effects reported. She is also concerned about having another stroke. History of lacunar infarction found on brain MRI, 07/26/2017. She is not on statin medication, she is taking Aspirin 81 mg daily.  Lab Results  Component Value Date   CHOL 196 02/28/2017   HDL 48.80 02/28/2017   LDLCALC 120 (H) 02/28/2017   TRIG 136.0 02/28/2017   CHOLHDL 4 02/28/2017   She is no longer having headaches.  Negative for visual changes, exertional chest pain, dyspnea,  focal weakness, or edema.  She denies gross hematuria, foaming urine, or decreased urine output.   Lab Results  Component Value Date   CREATININE 0.74 09/22/2017   BUN 8 09/22/2017   NA 143 09/22/2017   K 4.3 09/22/2017   CL 104 09/22/2017   CO2 30 09/22/2017    Having episodes of anxiety, Valium 5 mg 0.5 to 1 tablet daily as needed helps. For the past few weeks he has had some episodes of crying spells. She denies suicidal thoughts. She has been reluctant to try SSRI medication.   Review of Systems  Constitutional: Negative for activity change, appetite change, fatigue and fever.  HENT: Negative for mouth sores, nosebleeds and trouble swallowing.   Eyes: Negative for redness and visual disturbance.  Respiratory: Negative for cough, shortness of breath and wheezing.   Cardiovascular: Negative for chest pain, palpitations and leg swelling.  Gastrointestinal: Negative for abdominal pain, nausea and vomiting.       Negative for changes in bowel habits.  Genitourinary: Negative for decreased urine volume and hematuria.  Neurological: Negative for syncope, weakness and headaches.    Psychiatric/Behavioral: Negative for confusion. The patient is nervous/anxious.      Current Outpatient Medications on File Prior to Visit  Medication Sig Dispense Refill  . aspirin EC 81 MG tablet Take 81 mg by mouth daily.    . diazepam (VALIUM) 5 MG tablet Take 0.5-1 tablets (2.5-5 mg total) by mouth every 12 (twelve) hours as needed for anxiety. 20 tablet 1  . TURMERIC PO Take 1 tablet by mouth daily.     No current facility-administered medications on file prior to visit.      Past Medical History:  Diagnosis Date  . Hypertension     Allergies  Allergen Reactions  . Erythromycin     IV    Social History   Socioeconomic History  . Marital status: Married    Spouse name: Amada JupiterDale  . Number of children: Not on file  . Years of education: Not on file  . Highest education level: Bachelor's degree (e.g., BA, AB, BS)  Occupational History  . Not on file  Social Needs  . Financial resource strain: Not on file  . Food insecurity:    Worry: Not on file    Inability: Not on file  . Transportation needs:    Medical: Not on file    Non-medical: Not on file  Tobacco Use  . Smoking status: Former Smoker    Last attempt to quit: 05/19/2017    Years since quitting: 0.6  . Smokeless tobacco: Never Used  Substance  and Sexual Activity  . Alcohol use: No  . Drug use: No  . Sexual activity: Not on file  Lifestyle  . Physical activity:    Days per week: Not on file    Minutes per session: Not on file  . Stress: Not on file  Relationships  . Social connections:    Talks on phone: Not on file    Gets together: Not on file    Attends religious service: Not on file    Active member of club or organization: Not on file    Attends meetings of clubs or organizations: Not on file    Relationship status: Not on file  Other Topics Concern  . Not on file  Social History Narrative   Patient is right-handed. She lives with her husband in a one story house. She avoids caffeine. She  does not exercise. She is a retired Programmer, systems.    Vitals:   01/06/18 1159  BP: (!) 145/80  Pulse: 75  Resp: 12  Temp: 98.4 F (36.9 C)  SpO2: 97%   Body mass index is 30.59 kg/m.   Physical Exam  Nursing note and vitals reviewed. Constitutional: She is oriented to person, place, and time. She appears well-developed. No distress.  HENT:  Head: Normocephalic and atraumatic.  Mouth/Throat: Oropharynx is clear and moist and mucous membranes are normal.  Eyes: Pupils are equal, round, and reactive to light. Conjunctivae are normal.  Cardiovascular: Normal rate and regular rhythm.  No murmur heard. Pulses:      Dorsalis pedis pulses are 2+ on the right side, and 2+ on the left side.  Respiratory: Effort normal and breath sounds normal. No respiratory distress.  GI: Soft. She exhibits no mass. There is no hepatomegaly. There is no tenderness.  Musculoskeletal: She exhibits no edema.  Lymphadenopathy:    She has no cervical adenopathy.  Neurological: She is alert and oriented to person, place, and time. She has normal strength. No cranial nerve deficit. Gait normal.  Skin: Skin is warm. No rash noted. No erythema.  Psychiatric: Her mood appears anxious.  Well groomed, good eye contact.    ASSESSMENT AND PLAN:   Ms. DEIJA BUHRMAN is here today for HTN and anxiety follow up.  Diagnoses and all orders for this visit:  Hypertension, essential, benign BP intermittently elevated, stress and anxiety could be playing a role. Today we stopped HCTZ 12.5 mg. Lisinopril 10 mg to continue the morning and at night, she can skip the night dose if BP is <140/90. Possible complications of poorly controlled hypertension discussed. Continue low-salt diet. We will cancel appointment she has in 2 weeks and she will follow in January 2020, before if needed.  Anxiety disorder, unspecified Problem is not well controlled. She will continue Valium 5 mg daily as needed. She is not interested in  taking daily SSRI, recommend Lexapro 5 mg.  She is afraid of possible side effects. She will let me know if she decides to take Lexapro.  Lacunar infarction [facility]) She is very concerned about this problem, afraid of having another episode of CVA. She is currently on Aspirin 81 mg daily. She is not interested in starting medication, she is afraid of side effects. Adequate BP control.    -Ms. KEASIA DUBOSE advised to return sooner than planned today if new concerns arise.     Quentina Fronek G. [place], MD  [facility]. [place] office.

## 2018-01-06 NOTE — Assessment & Plan Note (Signed)
BP intermittently elevated, stress and anxiety could be playing a role. Today we stopped HCTZ 12.5 mg. Lisinopril 10 mg to continue the morning and at night, she can skip the night dose if BP is <140/90. Possible complications of poorly controlled hypertension discussed. Continue low-salt diet. We will cancel appointment she has in 2 weeks and she will follow in January 2020, before if needed.

## 2018-01-09 ENCOUNTER — Other Ambulatory Visit: Payer: Self-pay | Admitting: Family Medicine

## 2018-01-09 ENCOUNTER — Telehealth: Payer: Self-pay | Admitting: Family Medicine

## 2018-01-09 MED ORDER — ESCITALOPRAM OXALATE 5 MG PO TABS: 5.0000 mg | ORAL_TABLET | Freq: Every day | ORAL | 2 refills | Status: DC

## 2018-01-09 NOTE — Telephone Encounter (Signed)
Copied from CRM [phone]. Topic: Quick Communication - See Telephone Encounter >> Jan 09, 2018 11:12 AM Windy KalataMichael, Yanissa Michalsky L, NT wrote: CRM for notification. See Telephone encounter for: 01/09/18.  Patient is calling and states she was seen on 01/06/18 and Lexapro was discussed on that visit and she said she would like to think about it and she has decided to try it. Please advise.  [id number] [place],  - [address] [place] [id number] Phone: [phone] Fax: [phone]

## 2018-01-09 NOTE — Telephone Encounter (Signed)
Lexapro low-dose, 5 mg, was sent to her pharmacy to take once daily.  Thanks, BJ

## 2018-01-12 ENCOUNTER — Encounter: Payer: Self-pay | Admitting: Family Medicine

## 2018-01-19 ENCOUNTER — Ambulatory Visit: Payer: Medicare Other | Admitting: Family Medicine

## 2018-03-02 ENCOUNTER — Encounter: Payer: Medicare Other | Admitting: Family Medicine

## 2018-03-02 NOTE — Progress Notes (Deleted)
HPI:   Ms.Estee H Stacer is a 73 y.o. female, who is here today for her routine physical.  Last CPE: 02/28/17  Regular exercise 3 or more time per week: *** Following a healthy diet: *** She lives with ***  Chronic medical problems: ***  Pap smear **** Hx of abnormal pap smears: *** Hx of STD's ***   There is no immunization history on file for this patient.  Mammogram: *** Colonoscopy: *** DEXA: ***  Hep C screening (if born 59-1966): ***  She has *** concerns today.   *** lives with ***. Independent ADL's and IADL's. *** falls in the past year and denies depression symptoms.  Functional Status Survey:    Fall Risk  07/30/2017 02/28/2017  Falls in the past year? No No     Providers *** sees regularly:  Eye care provider: ***  Depression screen [facility] 2/9 02/28/2017  Decreased Interest 0  Down, Depressed, Hopeless 0  PHQ - 2 Score 0       No exam data present    Review of Systems    Current Outpatient Medications on File Prior to Visit  Medication Sig Dispense Refill  . aspirin EC 81 MG tablet Take 81 mg by mouth daily.    . diazepam (VALIUM) 5 MG tablet Take 0.5-1 tablets (2.5-5 mg total) by mouth every 12 (twelve) hours as needed for anxiety. 20 tablet 1  . escitalopram (LEXAPRO) 5 MG tablet Take 1 tablet (5 mg total) by mouth daily. 30 tablet 2  . lisinopril (PRINIVIL,ZESTRIL) 10 MG tablet Take 2 tablets (20 mg total) by mouth daily. 60 tablet 1  . TURMERIC PO Take 1 tablet by mouth daily.     No current facility-administered medications on file prior to visit.      Past Medical History:  Diagnosis Date  . Hypertension     Past Surgical History:  Procedure Laterality Date  . APPENDECTOMY    . TONSILLECTOMY      Allergies  Allergen Reactions  . Erythromycin     IV    Family History  Problem Relation Age of Onset  . Arthritis Mother   . Heart disease Mother   . Arthritis Father   . Heart disease Father   . Diabetes  Maternal Grandfather     Social History   Socioeconomic History  . Marital status: Married    Spouse name: Amada Jupiter  . Number of children: Not on file  . Years of education: Not on file  . Highest education level: Bachelor's degree (e.g., BA, AB, BS)  Occupational History  . Not on file  Social Needs  . Financial resource strain: Not on file  . Food insecurity:    Worry: Not on file    Inability: Not on file  . Transportation needs:    Medical: Not on file    Non-medical: Not on file  Tobacco Use  . Smoking status: Former Smoker    Last attempt to quit: 05/19/2017    Years since quitting: 0.7  . Smokeless tobacco: Never Used  Substance and Sexual Activity  . Alcohol use: No  . Drug use: No  . Sexual activity: Not on file  Lifestyle  . Physical activity:    Days per week: Not on file    Minutes per session: Not on file  . Stress: Not on file  Relationships  . Social connections:    Talks on phone: Not on file    Gets together:  Not on file    Attends religious service: Not on file    Active member of club or organization: Not on file    Attends meetings of clubs or organizations: Not on file    Relationship status: Not on file  Other Topics Concern  . Not on file  Social History Narrative   Patient is right-handed. She lives with her husband in a one story house. She avoids caffeine. She does not exercise. She is a retired Programmer, systemseducator.     There were no vitals filed for this visit. There is no height or weight on file to calculate BMI.   Wt Readings from Last 3 Encounters:  01/06/18 167 lb 4 oz (75.9 kg)  10/21/17 160 lb 4 oz (72.7 kg)  10/01/17 154 lb 6 oz (70 kg)      Physical Exam    ASSESSMENT AND PLAN:  Ms. Lindley MagnusGloria H Ozga was here today annual physical examination.     No orders of the defined types were placed in this encounter.   Diagnoses and all orders for this visit:  Hypertension, essential, benign  Hyperlipidemia, unspecified  hyperlipidemia type      No problem-specific Assessment & Plan notes found for this encounter.            No follow-ups on file.           G. [place], MD  [facility]. [place] office.

## 2018-03-13 ENCOUNTER — Encounter: Payer: Self-pay | Admitting: Family Medicine

## 2018-03-13 ENCOUNTER — Ambulatory Visit (INDEPENDENT_AMBULATORY_CARE_PROVIDER_SITE_OTHER): Payer: Medicare Other | Admitting: Family Medicine

## 2018-03-13 VITALS — BP 130/80 | HR 77 | Temp 98.1°F | Resp 12 | Ht 62.0 in | Wt 167.4 lb

## 2018-03-13 DIAGNOSIS — I10 Essential (primary) hypertension: Secondary | ICD-10-CM

## 2018-03-13 DIAGNOSIS — Z0000 Encounter for general adult medical examination without abnormal findings: Secondary | ICD-10-CM | POA: Diagnosis not present

## 2018-03-13 DIAGNOSIS — F419 Anxiety disorder, unspecified: Secondary | ICD-10-CM | POA: Diagnosis not present

## 2018-03-13 DIAGNOSIS — E785 Hyperlipidemia, unspecified: Secondary | ICD-10-CM

## 2018-03-13 LAB — COMPREHENSIVE METABOLIC PANEL
ALK PHOS: 61 U/L (ref 39–117)
ALT: 20 U/L (ref 0–35)
AST: 19 U/L (ref 0–37)
Albumin: 4.1 g/dL (ref 3.5–5.2)
BUN: 15 mg/dL (ref 6–23)
CALCIUM: 9.6 mg/dL (ref 8.4–10.5)
CO2: 30 mEq/L (ref 19–32)
CREATININE: 0.74 mg/dL (ref 0.40–1.20)
Chloride: 104 mEq/L (ref 96–112)
GFR: 76.83 mL/min (ref >60.00)
Glucose, Bld: 80 mg/dL (ref 70–99)
POTASSIUM: 4.4 meq/L (ref 3.5–5.1)
SODIUM: 142 meq/L (ref 135–145)
Total Bilirubin: 0.6 mg/dL (ref 0.2–1.2)
Total Protein: 6.2 g/dL (ref 6.0–8.3)

## 2018-03-13 LAB — LIPID PANEL
Cholesterol: 200 mg/dL (ref 0–200)
HDL: 54.1 mg/dL (ref >39.00)
LDL Cholesterol: 115 mg/dL — ABNORMAL HIGH (ref 0–99)
NonHDL: 145.85
TRIGLYCERIDES: 154 mg/dL — AB (ref 0.0–149.0)
Total CHOL/HDL Ratio: 4
VLDL: 30.8 mg/dL (ref 0.0–40.0)

## 2018-03-13 MED ORDER — LISINOPRIL 10 MG PO TABS: 20.0000 mg | ORAL_TABLET | Freq: Two times a day (BID) | ORAL | 1 refills | Status: DC

## 2018-03-13 MED ORDER — HYDROCHLOROTHIAZIDE 12.5 MG PO TABS: 12.5000 mg | ORAL_TABLET | Freq: Every day | ORAL | 2 refills | Status: DC

## 2018-03-13 NOTE — Progress Notes (Signed)
HPI:   Danielle Trevino is a 73 y.o. female, who is here today with her husband for her routine physical.  Last CPE: 02/28/17  Regular exercise 3 or more time per week: No Following a healthy diet: Yes She lives with her husband.  Chronic medical problems: HTN,CVA,HLD,and anxiety among some.   HTN: SHe is on Lisinopril 10 mg bid and HCTZ 12.5 mg daily. BP has been better controlled. Denies severe/frequent headache, visual changes, chest pain, dyspnea, palpitation, claudication, focal weakness, or worsening edema.  HLD and CVD she has refused taking statin med. She is on Aspirin 81 mg daily.  Anxiety: She is on Valium 5 mg bid prn. Lexapro was recommended last visit.Problem improved,so she did not start medication.   There is no immunization history on file for this patient.  She has refused screening test. She is not interested in further screening.  Independent ADL's and IADL's. No falls in the past year and denies depression symptoms.  Functional Status Survey: Is the patient deaf or have difficulty hearing?: Yes(Hearing aids) Does the patient have difficulty seeing, even when wearing glasses/contacts?: No Does the patient have difficulty concentrating, remembering, or making decisions?: No Does the patient have difficulty walking or climbing stairs?: No Does the patient have difficulty dressing or bathing?: No Does the patient have difficulty doing errands alone such as visiting a doctor's office or shopping?: No  Fall Risk  03/13/2018 07/30/2017 02/28/2017  Falls in the past year? 0 No No  Number falls in past yr: 0 - -  Injury with Fall? 0 - -  Follow up Education provided - -     Providers she sees regularly: Eye care provider: [facility].  [facility] 03/13/2018  Decreased Interest 0  Down, Depressed, Hopeless 0  PHQ - 2 Score 0   Mini-Cog - 03/13/18 0840    Normal clock drawing test?  yes    How many words correct?  3        Visual Acuity Screening   Right eye Left eye Both eyes  Without correction: 20/40 20/40 20/30   With correction:         Review of Systems  Constitutional: Negative for appetite change, fatigue and fever.  HENT: Positive for hearing loss. Negative for dental problem, mouth sores, sore throat and trouble swallowing.   Eyes: Negative for redness and visual disturbance.  Respiratory: Negative for cough, shortness of breath and wheezing.   Cardiovascular: Negative for chest pain and leg swelling.  Gastrointestinal: Negative for abdominal pain, nausea and vomiting.       No changes in bowel habits.  Endocrine: Negative for cold intolerance, heat intolerance, polydipsia, polyphagia and polyuria.  Genitourinary: Negative for decreased urine volume, dysuria, hematuria, vaginal bleeding and vaginal discharge.  Musculoskeletal: Positive for arthralgias. Negative for gait problem and myalgias.  Skin: Negative for color change and rash.  Allergic/Immunologic: Negative for environmental allergies.  Neurological: Negative for syncope, weakness and headaches.  Hematological: Negative for adenopathy. Does not bruise/bleed easily.  Psychiatric/Behavioral: Negative for confusion and sleep disturbance. The patient is nervous/anxious.   All other systems reviewed and are negative.     Current Outpatient Medications on File Prior to Visit  Medication Sig Dispense Refill  . aspirin EC 81 MG tablet Take 81 mg by mouth daily.    . diazepam (VALIUM) 5 MG tablet Take 0.5-1 tablets (2.5-5 mg total) by mouth every 12 (twelve) hours as needed for anxiety. 20 tablet  1  . TURMERIC PO Take 1 tablet by mouth daily.     No current facility-administered medications on file prior to visit.      Past Medical History:  Diagnosis Date  . Hypertension     Past Surgical History:  Procedure Laterality Date  . APPENDECTOMY    . TONSILLECTOMY      Allergies  Allergen Reactions  . Erythromycin     IV      Family History  Problem Relation Age of Onset  . Arthritis Mother   . Heart disease Mother   . Arthritis Father   . Heart disease Father   . Diabetes Maternal Grandfather     Social History   Socioeconomic History  . Marital status: Married    Spouse name: Danielle Trevino  . Number of children: Not on file  . Years of education: Not on file  . Highest education level: Bachelor's degree (e.g., BA, AB, BS)  Occupational History  . Not on file  Social Needs  . Financial resource strain: Not on file  . Food insecurity:    Worry: Not on file    Inability: Not on file  . Transportation needs:    Medical: Not on file    Non-medical: Not on file  Tobacco Use  . Smoking status: Former Smoker    Last attempt to quit: 05/19/2017    Years since quitting: 0.8  . Smokeless tobacco: Never Used  Substance and Sexual Activity  . Alcohol use: No  . Drug use: No  . Sexual activity: Not on file  Lifestyle  . Physical activity:    Days per week: Not on file    Minutes per session: Not on file  . Stress: Not on file  Relationships  . Social connections:    Talks on phone: Not on file    Gets together: Not on file    Attends religious service: Not on file    Active member of club or organization: Not on file    Attends meetings of clubs or organizations: Not on file    Relationship status: Not on file  Other Topics Concern  . Not on file  Social History Narrative   Patient is right-handed. She lives with her husband in a one story house. She avoids caffeine. She does not exercise. She is a retired Programmer, systemseducator.     Vitals:   03/13/18 0803  BP: 130/80  Pulse: 77  Resp: 12  Temp: 98.1 F (36.7 C)  SpO2: 98%   Body mass index is 30.61 kg/m.   Wt Readings from Last 3 Encounters:  03/13/18 167 lb 6 oz (75.9 kg)  01/06/18 167 lb 4 oz (75.9 kg)  10/21/17 160 lb 4 oz (72.7 kg)     Physical Exam  Nursing note and vitals reviewed. Constitutional: She is oriented to person, place,  and time. She appears well-developed. No distress.  HENT:  Head: Normocephalic and atraumatic.  Right Ear: Hearing, tympanic membrane, external ear and ear canal normal.  Left Ear: Hearing, tympanic membrane, external ear and ear canal normal.  Mouth/Throat: Uvula is midline, oropharynx is clear and moist and mucous membranes are normal.  Eyes: Pupils are equal, round, and reactive to light. Conjunctivae and EOM are normal.  Neck: No tracheal deviation present. No thyromegaly present.  Cardiovascular: Normal rate and regular rhythm.  No murmur heard. Pulses:      Dorsalis pedis pulses are 2+ on the right side and 2+ on the left side.  Varicose veins LE, bilateral.  Respiratory: Effort normal and breath sounds normal. No respiratory distress.  GI: Soft. She exhibits no mass. There is no hepatomegaly. There is no abdominal tenderness.  Musculoskeletal:        General: Edema (TRace pitting LE edema,bilateral.) present.     Comments: No signs of synovitis appreciated.  Lymphadenopathy:    She has no cervical adenopathy.       Right: No supraclavicular adenopathy present.       Left: No supraclavicular adenopathy present.  Neurological: She is alert and oriented to person, place, and time. She has normal strength. No cranial nerve deficit. Coordination and gait normal.  Reflex Scores:      Bicep reflexes are 2+ on the right side and 2+ on the left side.      Patellar reflexes are 2+ on the right side and 2+ on the left side. Skin: Skin is warm. No rash noted. No erythema.  Psychiatric: Her mood appears anxious.  Well groomed, good eye contact.    ASSESSMENT AND PLAN:  Danielle Trevino was here today annual physical examination.   Orders Placed This Encounter  Procedures  . Comprehensive metabolic panel  . Lipid panel    Lab Results  Component Value Date   ALT 20 03/13/2018   AST 19 03/13/2018   ALKPHOS 61 03/13/2018   BILITOT 0.6 03/13/2018   Lab Results  Component  Value Date   CREATININE 0.74 03/13/2018   BUN 15 03/13/2018   NA 142 03/13/2018   K 4.4 03/13/2018   CL 104 03/13/2018   CO2 30 03/13/2018   Lab Results  Component Value Date   CHOL 200 03/13/2018   HDL 54.10 03/13/2018   LDLCALC 115 (H) 03/13/2018   TRIG 154.0 (H) 03/13/2018   CHOLHDL 4 03/13/2018      Medicare annual wellness visit, subsequent We discussed the importance of staying active, physically and mentally, as well as the benefits of a healthy/balance diet. Low impact exercise that involve stretching and strengthing are ideal. She is not interested in further screening or vaccines. Fall prevention.  Advance directives and end of life discussed, she has POA and living will.    Routine general medical examination at a health care facility We discussed the importance of regular physical activity and healthy diet for prevention of chronic illness and/or complications. Preventive guidelines reviewed. Vaccination: Refused vaccinations.  Ca++ and vit D supplementation recommended for bone health. Next CPE in a year.   Hypertension, essential, benign Adequately controlled. No changes in current management. Low salt diet to continue. Eye exam up to date. F/U in 6 months, before if needed.  -     lisinopril (PRINIVIL,ZESTRIL) 10 MG tablet; Take 2 tablets (20 mg total) by mouth 2 (two) times daily. -     hydrochlorothiazide (HYDRODIURIL) 12.5 MG tablet; Take 1 tablet (12.5 mg total) by mouth daily. -     Comprehensive metabolic panel  Hyperlipidemia, unspecified hyperlipidemia type She is not interested in statin meds,we discussed benefits and some side effects. Continue low fat diet.  -     Lipid panel  Anxiety disorder, unspecified type Stable. Continue Valium 5 mg bid as needed. F/U in 5-6 months,before if needed.     Return in 6 months (on 09/11/2018) for HTN and anxiety.     Avonlea Sima G. [place], MD  [facility] [place]  office.

## 2018-03-13 NOTE — Patient Instructions (Addendum)
A few things to remember from today's visit:   Routine general medical examination at a health care facility  Hypertension, essential, benign - Plan: lisinopril (PRINIVIL,ZESTRIL) 10 MG tablet, Comprehensive metabolic panel  Hyperlipidemia, unspecified hyperlipidemia type - Plan: Lipid panel  Anxiety disorder, unspecified type   A few tips:  -As we age balance is not as good as it was, so there is a higher risks for falls. Please remove small rugs and furniture that is "in your way" and could increase the risk of falls. Stretching exercises may help with fall prevention: Yoga and Tai Chi are some examples. Low impact exercise is better, so you are not very achy the next day.  -Sun screen and avoidance of direct sun light recommended. Caution with dehydration, if working outdoors be sure to drink enough fluids.  - Some medications are not safe as we age, increases the risk of side effects and can potentially interact with other medication you are also taken;  including some of over the counter medications. Be sure to let me know when you start a new medication even if it is a dietary/vitamin supplement.   -Healthy diet low in red meet/animal fat and sugar + regular physical activity is recommended.       Screening recommendations for the next 5-10 years:  Colonoscopy up to 74 years old  Glaucoma screening/eye exam every 1-2 years.  Mammogram for breast cancer screening annually.  Flu vaccine annually.  Diabetes screening   Fall prevention

## 2018-03-15 ENCOUNTER — Encounter: Payer: Self-pay | Admitting: Family Medicine

## 2018-03-16 ENCOUNTER — Telehealth: Payer: Self-pay | Admitting: *Deleted

## 2018-03-16 NOTE — Telephone Encounter (Signed)
Lisinopril 10 mg tablet, Take 2 tablets (20 mg total) by mouth 2 (two) times daily  Insurance will not cover Lisinopril 10 mg tablet at 2 tabs twice daily, will not pay for 4 tabs daily. Can they switch to 20 mg

## 2018-03-17 ENCOUNTER — Other Ambulatory Visit: Payer: Self-pay | Admitting: Family Medicine

## 2018-03-17 DIAGNOSIS — I10 Essential (primary) hypertension: Secondary | ICD-10-CM

## 2018-03-17 MED ORDER — LISINOPRIL 20 MG PO TABS: 20.0000 mg | ORAL_TABLET | Freq: Every day | ORAL | 1 refills | Status: DC

## 2018-03-17 NOTE — Telephone Encounter (Signed)
I sent lisinopril 20 mg to her pharmacy to continue 0.5 tablet twice daily. Thanks, BJ

## 2018-04-09 ENCOUNTER — Other Ambulatory Visit: Payer: Self-pay | Admitting: Family Medicine

## 2018-04-09 DIAGNOSIS — I10 Essential (primary) hypertension: Secondary | ICD-10-CM

## 2018-05-03 ENCOUNTER — Other Ambulatory Visit: Payer: Self-pay | Admitting: Family Medicine

## 2018-05-04 ENCOUNTER — Other Ambulatory Visit: Payer: Self-pay | Admitting: Family Medicine

## 2018-05-04 DIAGNOSIS — I10 Essential (primary) hypertension: Secondary | ICD-10-CM

## 2018-05-04 MED ORDER — LISINOPRIL 20 MG PO TABS: 20.0000 mg | ORAL_TABLET | Freq: Every day | ORAL | 1 refills | Status: DC

## 2018-05-04 NOTE — Telephone Encounter (Signed)
Copied from CRM [id number]. Topic: Quick Communication - Rx Refill/Question >> May 04, 2018 10:58 AM Burchel, Abbi R wrote: Medication: lisinopril (PRINIVIL,ZESTRIL) 20 MG tablet,   Preferred Pharmacy:  [id number] [place], [place] - [address]  [phone] (Phone) [phone] (Fax)     Pt was advised that RX refills may take up to 3 business days. We ask that you follow-up with your pharmacy.

## 2018-05-07 ENCOUNTER — Encounter: Payer: Self-pay | Admitting: Family Medicine

## 2018-05-08 ENCOUNTER — Other Ambulatory Visit: Payer: Self-pay | Admitting: *Deleted

## 2018-05-08 MED ORDER — LISINOPRIL 10 MG PO TABS: 10.0000 mg | ORAL_TABLET | Freq: Two times a day (BID) | ORAL | 3 refills | Status: DC

## 2018-09-11 ENCOUNTER — Ambulatory Visit: Payer: Medicare Other | Admitting: Family Medicine

## 2018-09-14 ENCOUNTER — Ambulatory Visit: Payer: Medicare Other | Admitting: Family Medicine

## 2018-09-14 ENCOUNTER — Encounter: Payer: Self-pay | Admitting: Family Medicine

## 2018-09-14 VITALS — BP 130/84 | HR 85 | Temp 97.9°F | Resp 12 | Ht 62.0 in | Wt 171.8 lb

## 2018-09-14 DIAGNOSIS — F419 Anxiety disorder, unspecified: Secondary | ICD-10-CM | POA: Diagnosis not present

## 2018-09-14 DIAGNOSIS — I6381 Other cerebral infarction due to occlusion or stenosis of small artery: Secondary | ICD-10-CM | POA: Diagnosis not present

## 2018-09-14 DIAGNOSIS — E785 Hyperlipidemia, unspecified: Secondary | ICD-10-CM | POA: Diagnosis not present

## 2018-09-14 DIAGNOSIS — I10 Essential (primary) hypertension: Secondary | ICD-10-CM | POA: Diagnosis not present

## 2018-09-14 LAB — LIPID PANEL
Cholesterol: 200 mg/dL (ref 0–200)
HDL: 49.7 mg/dL (ref >39.00)
LDL Cholesterol: 115 mg/dL — ABNORMAL HIGH (ref 0–99)
NonHDL: 150.07
Total CHOL/HDL Ratio: 4
Triglycerides: 173 mg/dL — ABNORMAL HIGH (ref 0.0–149.0)
VLDL: 34.6 mg/dL (ref 0.0–40.0)

## 2018-09-14 LAB — BASIC METABOLIC PANEL
BUN: 11 mg/dL (ref 6–23)
CO2: 29 mEq/L (ref 19–32)
Calcium: 9.5 mg/dL (ref 8.4–10.5)
Chloride: 103 mEq/L (ref 96–112)
Creatinine, Ser: 0.75 mg/dL (ref 0.40–1.20)
GFR: 75.54 mL/min (ref >60.00)
Glucose, Bld: 80 mg/dL (ref 70–99)
Potassium: 4.3 mEq/L (ref 3.5–5.1)
Sodium: 141 mEq/L (ref 135–145)

## 2018-09-14 MED ORDER — LISINOPRIL 10 MG PO TABS: 10.0000 mg | ORAL_TABLET | Freq: Two times a day (BID) | ORAL | 2 refills | Status: DC

## 2018-09-14 NOTE — Patient Instructions (Signed)
A few things to remember from today's visit:   Hypertension, essential, benign  Anxiety disorder, unspecified type  Hyperlipidemia, unspecified hyperlipidemia type  No changes today.  Please be sure medication list is accurate. If a new problem present, please set up appointment sooner than planned today.

## 2018-09-14 NOTE — Progress Notes (Signed)
HPI:   [name] is a 73 y.o. female, who is here today with her husband for chronic disease management. She has not had new problems since her last visit. Last seen on 03/13/2018. Hyperlipidemia, she is on nonpharmacologic treatment. She tries to follow a low-fat diet consistently.  History of CVA, she is on Aspirin 81 mg. Sshe has refused taking statin medication. Former smoker.  Lab Results  Component Value Date   CHOL 200 03/13/2018   HDL 54.10 03/13/2018   LDLCALC 115 (H) 03/13/2018   TRIG 154.0 (H) 03/13/2018   CHOLHDL 4 03/13/2018   HTN: Denies severe/frequent headache, visual changes, chest pain, dyspnea, palpitation, claudication, focal weakness, or edema. Currently she is on lisinopril 10 mg twice daily and HCTZ 12.5 mg daily. She has tolerated medication well, no side effects reported.  Lab Results  Component Value Date   CREATININE 0.74 03/13/2018   BUN 15 03/13/2018   NA 142 03/13/2018   K 4.4 03/13/2018   CL 104 03/13/2018   CO2 30 03/13/2018   Anxiety: She is on diazepam 5 mg every 12 hours as needed. Her father-in-law passed away in 05/2018, so the level of stress has decreased;therefore anxiety has improved. She has taken diazepam about 3-4 times after the death of her father-in-law. She denies depressed mood or suicidal thoughts. She is tolerating diazepam well, no side effects reported.  Review of Systems  Constitutional: Negative for activity change, chills and fever.  HENT: Negative for mouth sores and sore throat.   Respiratory: Negative for cough and wheezing.   Gastrointestinal: Negative for abdominal pain, nausea and vomiting.  Endocrine: Negative for cold intolerance and heat intolerance.  Genitourinary: Negative for decreased urine volume and hematuria.  Skin: Negative for rash.  Neurological: Negative for syncope and numbness.  Rest see pertinent positives and negatives per HPI.   Current Outpatient Medications on File  Prior to Visit  Medication Sig Dispense Refill  . aspirin EC 81 MG tablet Take 81 mg by mouth daily.    . diazepam (VALIUM) 5 MG tablet Take 0.5-1 tablets (2.5-5 mg total) by mouth every 12 (twelve) hours as needed for anxiety. 20 tablet 1  . hydrochlorothiazide (HYDRODIURIL) 12.5 MG tablet Take 1 tablet (12.5 mg total) by mouth daily. 90 tablet 2  . TURMERIC PO Take 1 tablet by mouth daily.     No current facility-administered medications on file prior to visit.      Past Medical History:  Diagnosis Date  . Hypertension    Allergies  Allergen Reactions  . Erythromycin     IV    Social History   Socioeconomic History  . Marital status: Married    Spouse name: Amada JupiterDale  . Number of children: Not on file  . Years of education: Not on file  . Highest education level: Bachelor's degree (e.g., BA, AB, BS)  Occupational History  . Not on file  Social Needs  . Financial resource strain: Not on file  . Food insecurity    Worry: Not on file    Inability: Not on file  . Transportation needs    Medical: Not on file    Non-medical: Not on file  Tobacco Use  . Smoking status: Former Smoker    Quit date: 05/19/2017    Years since quitting: 1.3  . Smokeless tobacco: Never Used  Substance and Sexual Activity  . Alcohol use: No  . Drug use: No  . Sexual activity: Not on  file  Lifestyle  . Physical activity    Days per week: Not on file    Minutes per session: Not on file  . Stress: Not on file  Relationships  . Social Herbalist on phone: Not on file    Gets together: Not on file    Attends religious service: Not on file    Active member of club or organization: Not on file    Attends meetings of clubs or organizations: Not on file    Relationship status: Not on file  Other Topics Concern  . Not on file  Social History Narrative   Patient is right-handed. She lives with her husband in a one story house. She avoids caffeine. She does not exercise. She is a retired  Tourist information centre manager.    Vitals:   09/14/18 1155  BP: 130/84  Pulse: 85  Resp: 12  Temp: 97.9 F (36.6 C)  SpO2: 94%   Body mass index is 31.42 kg/m.  Physical Exam  Nursing note and vitals reviewed. Constitutional: She is oriented to person, place, and time. She appears well-developed. No distress.  HENT:  Head: Normocephalic and atraumatic.  Mouth/Throat: Oropharynx is clear and moist. Mucous membranes are dry.  Eyes: Pupils are equal, round, and reactive to light. Conjunctivae are normal.  Cardiovascular: Normal rate and regular rhythm.  No murmur heard. Pulses:      Dorsalis pedis pulses are 2+ on the right side and 2+ on the left side.  Respiratory: Effort normal and breath sounds normal. No respiratory distress.  GI: Soft. She exhibits no mass. There is no hepatomegaly. There is no abdominal tenderness.  Musculoskeletal:        General: No edema.  Lymphadenopathy:    She has no cervical adenopathy.  Neurological: She is alert and oriented to person, place, and time. She has normal strength. No cranial nerve deficit. Gait normal.  Skin: Skin is warm. No rash noted. No erythema.  Psychiatric: She has a normal mood and affect.  Well groomed, good eye contact.    ASSESSMENT AND PLAN:  Danielle Trevino was seen today for follow-up.  Orders Placed This Encounter  Procedures  . Basic metabolic panel  . Lipid panel   Lab Results  Component Value Date   CREATININE 0.75 09/14/2018   BUN 11 09/14/2018   NA 141 09/14/2018   K 4.3 09/14/2018   CL 103 09/14/2018   CO2 29 09/14/2018   Lab Results  Component Value Date   CHOL 200 09/14/2018   HDL 49.70 09/14/2018   LDLCALC 115 (H) 09/14/2018   TRIG 173.0 (H) 09/14/2018   CHOLHDL 4 09/14/2018     Hyperlipidemia Still not interested in taking statin medication, she would like to have lipid panel check today. Continue nonpharmacologic treatment. Will give further recommendations according to lab result.   Hypertension, essential,  benign BP adequately controlled. Continue lisinopril 10 mg twice daily and HCTZ 12.5 mg daily. We discussed some side effects of medications. Continue monitoring BP regularly. Eye exam is current.   Anxiety disorder, unspecified Problem is better controlled. Continue diazepam 5 mg twice daily as needed. We discussed some side effects of benzodiazepines.  Hx of Lacunar infarction ([place]) With no significant residual deficit. Continue Aspirin 81 mg daily. We discussed the benefits of statin medication for CVD prevention.   Return in about 6 months (around 03/19/2019) for CPE.   -[name] was advised to return sooner than planned today if new concerns arise.  Betty G. Martinique, MD  [facility]. [place] office.

## 2018-09-14 NOTE — Assessment & Plan Note (Addendum)
Still not interested in taking statin medication, she would like to have lipid panel check today. Continue nonpharmacologic treatment. Will give further recommendations according to lab result.

## 2018-09-14 NOTE — Assessment & Plan Note (Signed)
Problem is better controlled. Continue diazepam 5 mg twice daily as needed. We discussed some side effects of benzodiazepines.

## 2018-09-14 NOTE — Assessment & Plan Note (Signed)
BP adequately controlled. Continue lisinopril 10 mg twice daily and HCTZ 12.5 mg daily. We discussed some side effects of medications. Continue monitoring BP regularly. Eye exam is current.

## 2018-09-14 NOTE — Assessment & Plan Note (Signed)
With no significant residual deficit. Continue Aspirin 81 mg daily. We discussed the benefits of statin medication for CVD prevention.

## 2018-09-17 ENCOUNTER — Encounter: Payer: Self-pay | Admitting: Family Medicine

## 2018-11-18 ENCOUNTER — Encounter: Payer: Self-pay | Admitting: Family Medicine

## 2018-11-23 ENCOUNTER — Encounter: Payer: Self-pay | Admitting: Family Medicine

## 2018-11-24 ENCOUNTER — Other Ambulatory Visit: Payer: Self-pay | Admitting: Family Medicine

## 2018-11-24 DIAGNOSIS — F419 Anxiety disorder, unspecified: Secondary | ICD-10-CM

## 2018-11-24 MED ORDER — DIAZEPAM 5 MG PO TABS: 2.5000 mg | ORAL_TABLET | Freq: Two times a day (BID) | ORAL | 2 refills | Status: DC | PRN

## 2018-12-22 ENCOUNTER — Other Ambulatory Visit: Payer: Self-pay | Admitting: Family Medicine

## 2018-12-22 DIAGNOSIS — I10 Essential (primary) hypertension: Secondary | ICD-10-CM

## 2019-01-16 IMAGING — MR MR HEAD W/O CM
11 series · 48 of 48 positions shown · non-contrast
Comparison: None.

ADDENDUM:
Study discussed by telephone with Dr Esperantist Bat, on-call for
Dr. BERTIC RINCIC on 07/26/2017 at 6877 hours.
CLINICAL DATA: 71-year-old female with intermittent symptoms of
severe headache, visual auras, and elevated blood pressure for the
past 2 months.

EXAM:
MRI HEAD WITHOUT CONTRAST
TECHNIQUE: Multiplanar, multiecho pulse sequences of the brain and surrounding
structures were obtained without intravenous contrast.

[Series 5: T1 · sagittal · 4.0mm · 0.75mm/px · 2 of 31 slices shown (1 of 2)]
[im 1/31]
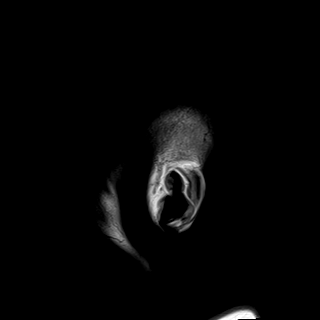
[im 31/31]
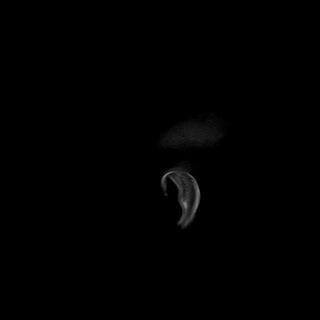

[Series 6: DWI · axial · 3.0mm · 1.44mm/px · z∈[-79,+71]mm · 7 of 96 slices shown (1 of 4)]
[im 1/96]
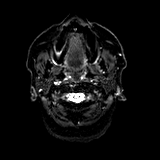
[im 16/96]
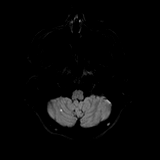
[im 32/96]
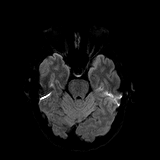
[im 48/96]
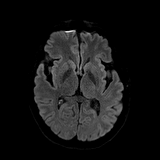
[im 64/96]
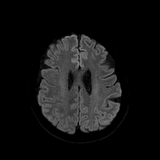
[im 80/96]
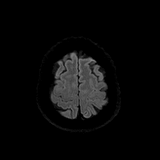
[im 96/96]
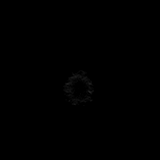

[Series 7: DWI · axial · 3.0mm · 1.44mm/px · z∈[-79,+71]mm · 3 of 48 slices shown (2 of 4)]
[im 1/48]
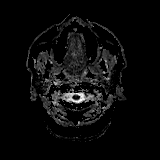
[im 24/48]
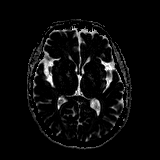
[im 48/48]
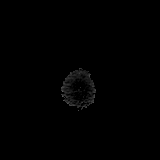

[Series 8: DWI · coronal · 5.0mm · 1.44mm/px · 4 of 60 slices shown (3 of 4)]
[im 1/60]
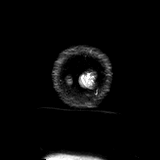
[im 20/60]
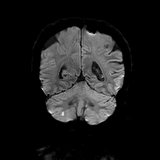
[im 40/60]
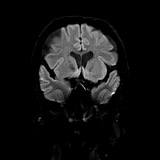
[im 60/60]
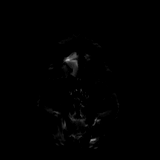

[Series 9: DWI · coronal · 5.0mm · 1.44mm/px · 2 of 30 slices shown (4 of 4)]
[im 1/30]
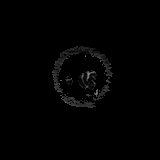
[im 30/30]
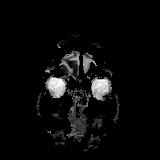

[Series 10: T2 · axial · 4.0mm · 0.36mm/px · z∈[-85,+71]mm · 2 of 32 slices shown (1 of 2)]
[im 1/32]
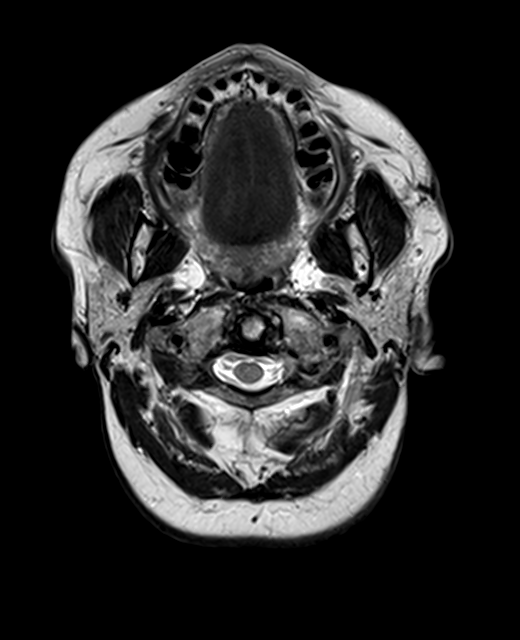
[im 32/32]
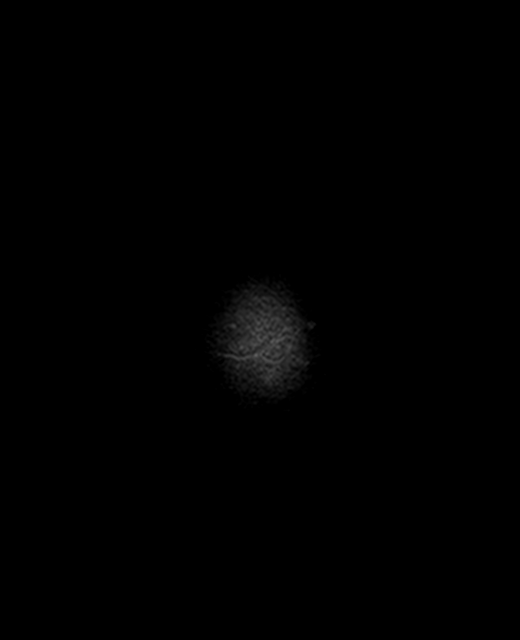

[Series 11: FLAIR · axial · 3.0mm · 0.72mm/px · z∈[-80,+65]mm · 2 of 26 slices shown]
[im 1/26]
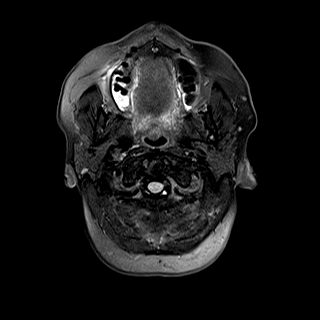
[im 26/26]
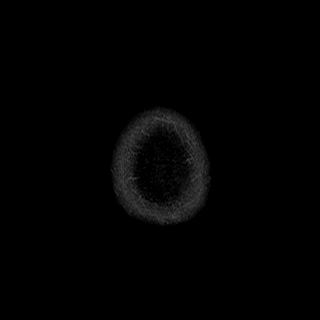

[Series 12: mip_images(sw) · axial · 12.0mm · 0.90mm/px · z∈[-71,+57]mm · 6 of 89 slices shown]
[im 1/89]
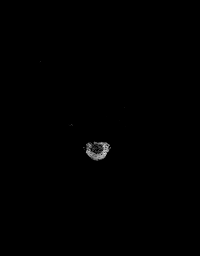
[im 18/89]
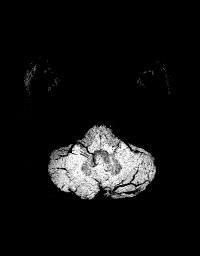
[im 36/89]
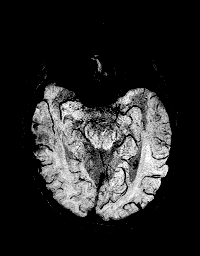
[im 53/89]
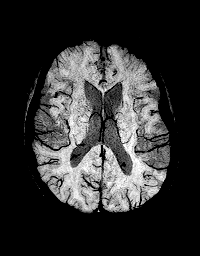
[im 71/89]
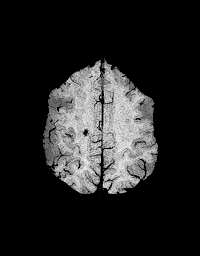
[im 89/89]
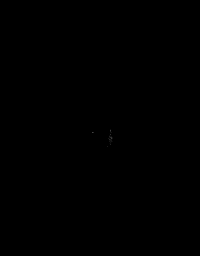

[Series 13: swi_images · axial · 1.5mm · 0.90mm/px · z∈[-76,+63]mm · 7 of 96 slices shown]
[im 1/96]
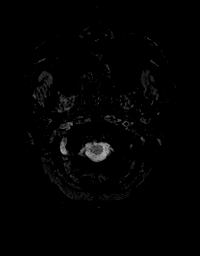
[im 16/96]
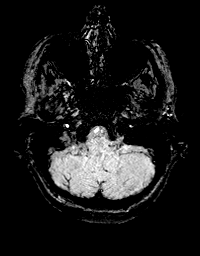
[im 32/96]
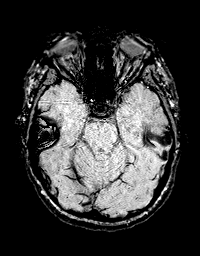
[im 48/96]
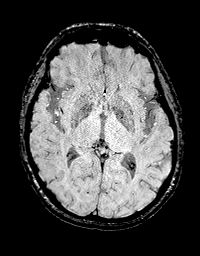
[im 64/96]
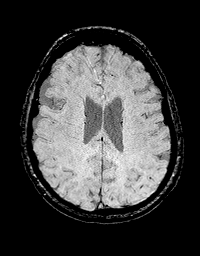
[im 80/96]
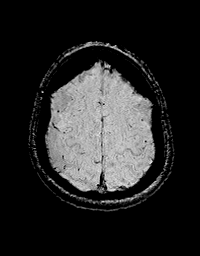
[im 96/96]
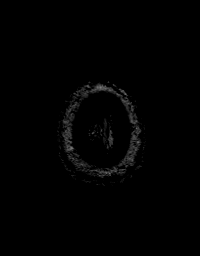

[Series 14: T1 · axial · 1.0mm · 0.90mm/px · z∈[-84,+71]mm · 11 of 160 slices shown (2 of 2)]
[im 1/160]
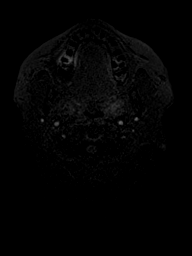
[im 16/160]
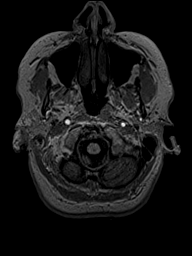
[im 32/160]
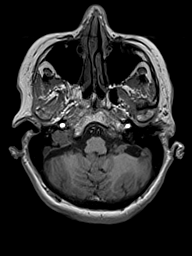
[im 48/160]
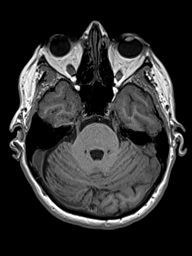
[im 64/160]
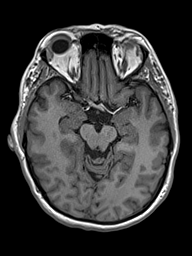
[im 80/160]
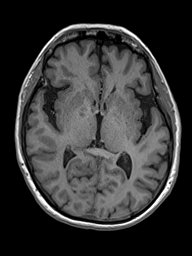
[im 96/160]
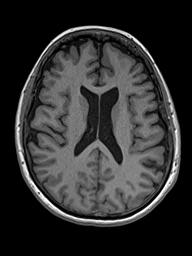
[im 112/160]
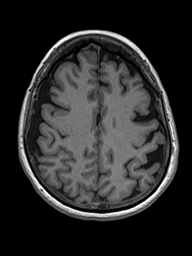
[im 128/160]
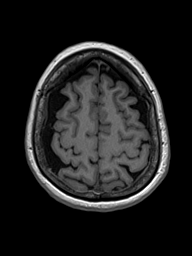
[im 144/160]
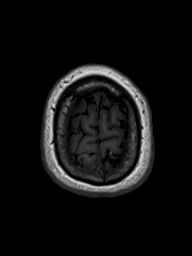
[im 160/160]
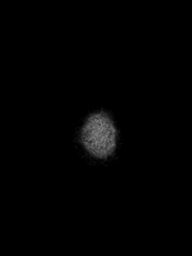

[Series 15: T2 · coronal · 4.5mm · 0.36mm/px · 2 of 30 slices shown (2 of 2)]
[im 1/30]
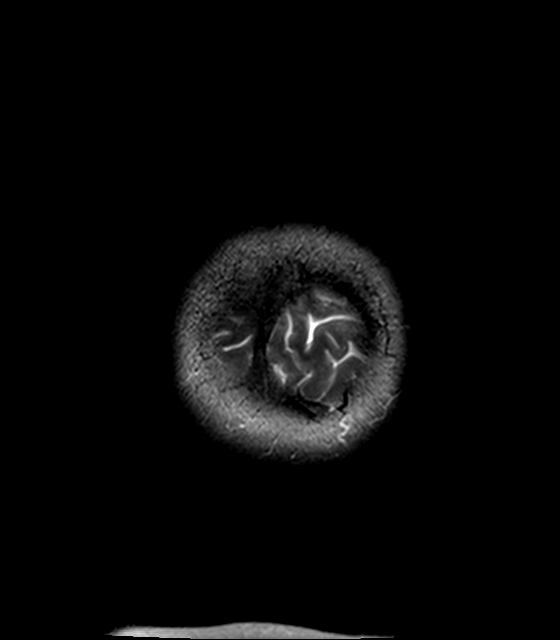
[im 30/30]
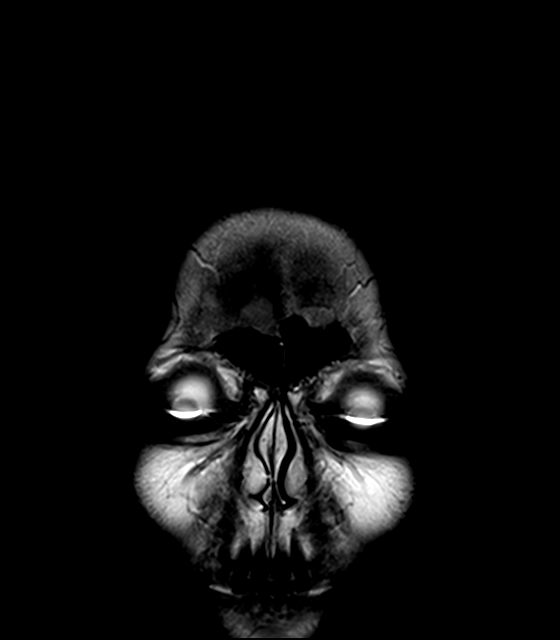

[48 of 48 positions shown; findings below may reference images not displayed]

FINDINGS: Brain: There is a small, partially linear 6-7 millimeter focus of
restricted diffusion in the lower right cerebellar hemisphere
(series 6, image 55 and also series 8, image 40), compatible with
small acute lacunar infarct. Associated T2 and FLAIR hyperintensity
with no associated hemorrhage or mass effect.

No other restricted diffusion. No midline shift, mass effect,
evidence of mass lesion, ventriculomegaly, extra-axial collection or
acute intracranial hemorrhage.

Outside of the acute finding, gray and white matter signal is normal
for age throughout the brain. No cortical encephalomalacia or
chronic cerebral blood products identified. The deep gray matter
nuclei and brainstem are normal for age. Cervicomedullary junction
and pituitary are within normal limits.

Vascular: Major intracranial vascular flow voids are preserved and
appear normal.

Skull and upper cervical spine: Incidental asymmetric dural
calcification suspected along the left vertex seen on coronal series
15, image 11. Visualized bone marrow signal is within normal limits.
Normal visible cervical spine.

Sinuses/Orbits: Normal orbits soft tissues. The paranasal sinuses
are clear.

Other: Visible internal auditory structures appear normal. The
mastoid air cells are well pneumatized. Scalp and face soft tissues
appear negative.
IMPRESSION: 1. Acute small vessel infarct in the right cerebellum, right PICA
territory. No associated hemorrhage or mass effect.
2. Otherwise normal for age noncontrast MRI appearance of the brain.

## 2019-03-17 ENCOUNTER — Telehealth: Payer: Self-pay

## 2019-03-17 ENCOUNTER — Ambulatory Visit (INDEPENDENT_AMBULATORY_CARE_PROVIDER_SITE_OTHER): Payer: Medicare PPO

## 2019-03-17 VITALS — BP 114/71 | Ht 62.0 in | Wt 170.0 lb

## 2019-03-17 DIAGNOSIS — Z0000 Encounter for general adult medical examination without abnormal findings: Secondary | ICD-10-CM | POA: Diagnosis not present

## 2019-03-17 NOTE — Patient Instructions (Addendum)
[name] , Thank you for taking time to participate in your Medicare Wellness Visit. I appreciate your ongoing commitment to your health goals. Please review the following plan we discussed and let me know if I can assist you in the future.   Screening recommendations/referrals: Colorectal Screening: patient declines; information sent on cologuard. Mammogram: patient declines Bone Density: patient declines; information provided on calcium/vit D supplements and calcium rich foods.   Vision and Dental Exams: Recommended annual ophthalmology exams for early detection of glaucoma and other disorders of the eye Recommended annual dental exams for proper oral hygiene  Diabetic Exams: Diabetic Eye Exam: N/A Diabetic Foot Exam: N/A  Vaccinations: Influenza vaccine: declines  Pneumococcal vaccine: declines  Tdap vaccine: completed 01/25/2013; expires on 01/26/2023. Shingles vaccine: patient declines.   Advanced directives: Advance directives discussed with you today. Please bring a copy of your POA (Power of [place]) and/or Living Will to your next appointment.  Goals: Recommend to drink at least 6-8 8oz glasses of water per day.  Recommend to exercise for at least 150 minutes per week.  Please follow low sodium diet to help control blood pressure and swelling in ankles. See below.   Next appointment: Please schedule your Annual Wellness Visit with your Nurse Health Advisor in one year.  Preventive Care 62 Years and Older, Female Preventive care refers to lifestyle choices and visits with your health care provider that can promote health and wellness. What does preventive care include?  A yearly physical exam. This is also called an annual well check.  Dental exams once or twice a year.  Routine eye exams. Ask your health care provider how often you should have your eyes checked.  Personal lifestyle choices, including:  Daily care of your teeth and gums.  Regular physical  activity.  Eating a healthy diet.  Avoiding tobacco and drug use.  Limiting alcohol use.  Practicing safe sex.  Taking low-dose aspirin every day if recommended by your health care provider.  Taking vitamin and mineral supplements as recommended by your health care provider. What happens during an annual well check? The services and screenings done by your health care provider during your annual well check will depend on your age, overall health, lifestyle risk factors, and family history of disease. Counseling  Your health care provider may ask you questions about your:  Alcohol use.  Tobacco use.  Drug use.  Emotional well-being.  Home and relationship well-being.  Sexual activity.  Eating habits.  History of falls.  Memory and ability to understand (cognition).  Work and work Statistician.  Reproductive health. Screening  You may have the following tests or measurements:  Height, weight, and BMI.  Blood pressure.  Lipid and cholesterol levels. These may be checked every 5 years, or more frequently if you are over 17 years old.  Skin check.  Lung cancer screening. You may have this screening every year starting at age 65 if you have a 30-pack-year history of smoking and currently smoke or have quit within the past 15 years.  Fecal occult blood test (FOBT) of the stool. You may have this test every year starting at age 1.  Flexible sigmoidoscopy or colonoscopy. You may have a sigmoidoscopy every 5 years or a colonoscopy every 10 years starting at age 97.  Hepatitis C blood test.  Hepatitis B blood test.  Sexually transmitted disease (STD) testing.  Diabetes screening. This is done by checking your blood sugar (glucose) after you have not eaten for a while (  fasting). You may have this done every 1-3 years.  Bone density scan. This is done to screen for osteoporosis. You may have this done starting at age 81.  Mammogram. This may be done every 1-2  years. Talk to your health care provider about how often you should have regular mammograms. Talk with your health care provider about your test results, treatment options, and if necessary, the need for more tests. Vaccines  Your health care provider may recommend certain vaccines, such as:  Influenza vaccine. This is recommended every year.  Tetanus, diphtheria, and acellular pertussis (Tdap, Td) vaccine. You may need a Td booster every 10 years.  Zoster vaccine. You may need this after age 71.  Pneumococcal 13-valent conjugate (PCV13) vaccine. One dose is recommended after age 10.  Pneumococcal polysaccharide (PPSV23) vaccine. One dose is recommended after age 56. Talk to your health care provider about which screenings and vaccines you need and how often you need them. This information is not intended to replace advice given to you by your health care provider. Make sure you discuss any questions you have with your health care provider. Document Released: 03/03/2015 Document Revised: 10/25/2015 Document Reviewed: 12/06/2014 Elsevier Interactive Patient Education  2017 [place] Prevention in the Home Falls can cause injuries. They can happen to people of all ages. There are many things you can do to make your home safe and to help prevent falls. What can I do on the outside of my home?  Regularly fix the edges of walkways and driveways and fix any cracks.  Remove anything that might make you trip as you walk through a door, such as a raised step or threshold.  Trim any bushes or trees on the path to your home.  Use bright outdoor lighting.  Clear any walking paths of anything that might make someone trip, such as rocks or tools.  Regularly check to see if handrails are loose or broken. Make sure that both sides of any steps have handrails.  Any raised decks and porches should have guardrails on the edges.  Have any leaves, snow, or ice cleared regularly.  Use  sand or salt on walking paths during winter.  Clean up any spills in your garage right away. This includes oil or grease spills. What can I do in the bathroom?  Use night lights.  Install grab bars by the toilet and in the tub and shower. Do not use towel bars as grab bars.  Use non-skid mats or decals in the tub or shower.  If you need to sit down in the shower, use a plastic, non-slip stool.  Keep the floor dry. Clean up any water that spills on the floor as soon as it happens.  Remove soap buildup in the tub or shower regularly.  Attach bath mats securely with double-sided non-slip rug tape.  Do not have throw rugs and other things on the floor that can make you trip. What can I do in the bedroom?  Use night lights.  Make sure that you have a light by your bed that is easy to reach.  Do not use any sheets or blankets that are too big for your bed. They should not hang down onto the floor.  Have a firm chair that has side arms. You can use this for support while you get dressed.  Do not have throw rugs and other things on the floor that can make you trip. What can I do in the kitchen?  Clean up any spills right away.  Avoid walking on wet floors.  Keep items that you use a lot in easy-to-reach places.  If you need to reach something above you, use a strong step stool that has a grab bar.  Keep electrical cords out of the way.  Do not use floor polish or wax that makes floors slippery. If you must use wax, use non-skid floor wax.  Do not have throw rugs and other things on the floor that can make you trip. What can I do with my stairs?  Do not leave any items on the stairs.  Make sure that there are handrails on both sides of the stairs and use them. Fix handrails that are broken or loose. Make sure that handrails are as long as the stairways.  Check any carpeting to make sure that it is firmly attached to the stairs. Fix any carpet that is loose or worn.  Avoid  having throw rugs at the top or bottom of the stairs. If you do have throw rugs, attach them to the floor with carpet tape.  Make sure that you have a light switch at the top of the stairs and the bottom of the stairs. If you do not have them, ask someone to add them for you. What else can I do to help prevent falls?  Wear shoes that:  Do not have high heels.  Have rubber bottoms.  Are comfortable and fit you well.  Are closed at the toe. Do not wear sandals.  If you use a stepladder:  Make sure that it is fully opened. Do not climb a closed stepladder.  Make sure that both sides of the stepladder are locked into place.  Ask someone to hold it for you, if possible.  Clearly mark and make sure that you can see:  Any grab bars or handrails.  First and last steps.  Where the edge of each step is.  Use tools that help you move around (mobility aids) if they are needed. These include:  Canes.  Walkers.  Scooters.  Crutches.  Turn on the lights when you go into a dark area. Replace any light bulbs as soon as they burn out.  Set up your furniture so you have a clear path. Avoid moving your furniture around.  If any of your floors are uneven, fix them.  If there are any pets around you, be aware of where they are.  Review your medicines with your doctor. Some medicines can make you feel dizzy. This can increase your chance of falling. Ask your doctor what other things that you can do to help prevent falls. This information is not intended to replace advice given to you by your health care provider. Make sure you discuss any questions you have with your health care provider. Document Released: 12/01/2008 Document Revised: 07/13/2015 Document Reviewed: 03/11/2014 Elsevier Interactive Patient Education  2017 [employer].

## 2019-03-17 NOTE — Progress Notes (Signed)
This visit is being conducted via phone call due to the COVID-19 pandemic. This patient has given me verbal consent via phone to conduct this visit, patient states they are participating from their home address. Some vital signs may be absent or patient reported.   Patient identification: identified by name, DOB, and current address.  Location provider: [facility], Office Persons participating in the virtual visit: Mrs. Danielle Trevino and Danielle Man, LPN.    Subjective:   Danielle Trevino is a 74 y.o. female who presents for Medicare Annual (Subsequent) preventive examination.  Review of Systems:  No ROS; Annual Medicare Wellness Visit  Cardiac Risk Factors include: advanced age (>61men, >57 women);hypertension;dyslipidemia;sedentary lifestyle     Objective:     Vitals: BP 114/71 Comment: patient reported  Ht 5\' 2"  (1.575 m)   Wt 170 lb (77.1 kg)   BMI 31.09 kg/m   Body mass index is 31.09 kg/m.  Advanced Directives 03/17/2019  Does Patient Have a Medical Advance Directive? Yes  Type of 03/19/2019 of [place];Living will  Does patient want to make changes to medical advance directive? No - Patient declined  Copy of Healthcare Power of Attorney in Chart? No - copy requested    Tobacco Social History   Tobacco Use  Smoking Status Former Smoker  . Quit date: 05/19/2017  . Years since quitting: 1.8  Smokeless Tobacco Never Used     Counseling given: Not Answered   Clinical Intake:  Pre-visit preparation completed: Yes  Pain : No/denies pain     BMI - recorded: 31.09 Nutritional Status: BMI > 30  Obese Nutritional Risks: None Diabetes: No  How often do you need to have someone help you when you read instructions, pamphlets, or other written materials from your doctor or pharmacy?: 1 - Never What is the last grade level you completed in school?: bachelor's degree  Interpreter Needed?: No  Information entered by :: [phone],  LPN.  Past Medical History:  Diagnosis Date  . Hypertension    Past Surgical History:  Procedure Laterality Date  . APPENDECTOMY    . TONSILLECTOMY     Family History  Problem Relation Age of Onset  . Arthritis Mother   . Heart disease Mother   . Arthritis Father   . Heart disease Father   . Diabetes Maternal Grandfather    Social History   Socioeconomic History  . Marital status: Married    Spouse name: Danielle Trevino  . Number of children: 0  . Years of education: 16  . Highest education level: Bachelor's degree (e.g., BA, AB, BS)  Occupational History  . Occupation: retired  Tobacco Use  . Smoking status: Former Smoker    Quit date: 05/19/2017    Years since quitting: 1.8  . Smokeless tobacco: Never Used  Substance and Sexual Activity  . Alcohol use: No  . Drug use: No  . Sexual activity: Not on file  Other Topics Concern  . Not on file  Social History Narrative   Patient is right-handed. She lives with her husband in a one story house. She avoids caffeine. She does not exercise. She is a retired 07/19/2017.   Social Determinants of Health   Financial Resource Strain: Low Risk   . Difficulty of Paying Living Expenses: Not hard at all  Food Insecurity: No Food Insecurity  . Worried About Programmer, systems in the Last Year: Never true  . Ran Out of Food in the Last Year: Never true  Transportation Needs: No Transportation Needs  . Lack of Transportation (Medical): No  . Lack of Transportation (Non-Medical): No  Physical Activity: Inactive  . Days of Exercise per Week: 0 days  . Minutes of Exercise per Session: 0 min  Stress: No Stress Concern Present  . Feeling of Stress : Only a little  Social Connections: Not Isolated  . Frequency of Communication with Friends and Family: More than three times a week  . Frequency of Social Gatherings with Friends and Family: Once a week  . Attends Religious Services: More than 4 times per year  . Active Member of Clubs or  Organizations: Yes  . Attends Banker Meetings: More than 4 times per year  . Marital Status: Married    Outpatient Encounter Medications as of 03/17/2019  Medication Sig  . aspirin EC 81 MG tablet Take 81 mg by mouth daily.  . diazepam (VALIUM) 5 MG tablet Take 0.5-1 tablets (2.5-5 mg total) by mouth every 12 (twelve) hours as needed for anxiety.  . hydrochlorothiazide (HYDRODIURIL) 12.5 MG tablet TAKE 1 TABLET BY MOUTH EVERY DAY  . lisinopril (ZESTRIL) 10 MG tablet Take 1 tablet (10 mg total) by mouth 2 (two) times daily.  . TURMERIC PO Take 1 tablet by mouth daily.   No facility-administered encounter medications on file as of 03/17/2019.    Activities of Daily Living In your present state of health, do you have any difficulty performing the following activities: 03/17/2019  Hearing? N  Vision? N  Difficulty concentrating or making decisions? N  Walking or climbing stairs? N  Dressing or bathing? N  Doing errands, shopping? N  Preparing Food and eating ? N  Using the Toilet? N  In the past six months, have you accidently leaked urine? N  Do you have problems with loss of bowel control? N  Managing your Medications? N  Managing your Finances? N  Housekeeping or managing your Housekeeping? N  Some recent data might be hidden    Patient Care Team: [place], Betty G, MD as PCP - General (Family Medicine)    Assessment:   This is a routine wellness examination for [name].  Exercise Activities and Dietary recommendations Current Exercise Habits: The patient does not participate in regular exercise at present, Exercise limited by: None identified  Goals    . stop having visual aura's       Fall Risk Fall Risk  03/17/2019 03/15/2018 07/30/2017 02/28/2017  Falls in the past year? 0 0 No No  Number falls in past yr: - 0 - -  Injury with Fall? - 0 - -  Risk for fall due to : Medication side effect - - -  Follow up Falls evaluation completed;Education provided;Falls  prevention discussed Education provided - -   Is the patient's home free of loose throw rugs in walkways, pet beds, electrical cords, etc?   yes      Grab bars in the bathroom? yes      Handrails on the stairs?   yes      Adequate lighting?   yes  Timed Get Up and Go performed: N/A due to telephone visit  Depression Screen PHQ 2/9 Scores 03/17/2019 03/15/2018 02/28/2017  PHQ - 2 Score 0 0 0     Cognitive Function     6CIT Screen 03/17/2019  What Year? 0 points  What month? 0 points  What time? 0 points  Count back from 20 0 points  Months in reverse 0 points  Repeat phrase 0 points  Total Score 0     There is no immunization history on file for this patient.  Qualifies for Shingles Vaccine?yes but patient declines immunizations  Screening Tests Health Maintenance  Topic Date Due  . DEXA SCAN  09/20/2009  . INFLUENZA VACCINE  Discontinued  . MAMMOGRAM  Discontinued  . COLONOSCOPY  Discontinued  . TETANUS/TDAP  Discontinued  . Hepatitis C Screening  Discontinued  . PNA vac Low Risk Adult  Discontinued    Cancer Screenings: Lung: Low Dose CT Chest recommended if Age 92-80 years, 30 pack-year currently smoking OR have quit w/in 15years. Patient does not qualify. Breast:  Up to date on Mammogram? No ; patient declines further screenings Up to date of Bone Density/Dexa? No; patient declines screening.  Colorectal: no  Additional Screenings:  Hepatitis C Screening: patient declines screening.    Plan:   Mrs. Stangelo declines preventive health screenings and immunizations. Information provided to her on cologuard and calcium/vit D supplements, and calcium rich food.    I have personally reviewed and noted the following in the patient's chart:   . Medical and social history . Use of alcohol, tobacco or illicit drugs  . Current medications and supplements . Functional ability and status . Nutritional status . Physical activity . Advanced directives . List of other  physicians . Hospitalizations, surgeries, and ER visits in previous 12 months . Vitals . Screenings to include cognitive, depression, and falls . Referrals and appointments  In addition, I have reviewed and discussed with patient certain preventive protocols, quality metrics, and best practice recommendations. A written personalized care plan for preventive services as well as general preventive health recommendations were provided to patient.     Franne Forts, LPN  [id number]

## 2019-03-17 NOTE — Telephone Encounter (Signed)
Patient is still having "acne" related to HCTZ. She is ready to try the increase in Lisinopril. Please advise her with instructions.

## 2019-03-19 NOTE — Telephone Encounter (Signed)
So stop HCTZ 25 mg. Increase lisinopril from 20 mg to 30 mg. Continue monitoring BP. Thanks, BJ

## 2019-04-15 ENCOUNTER — Other Ambulatory Visit: Payer: Self-pay

## 2019-04-16 ENCOUNTER — Encounter: Payer: Self-pay | Admitting: Family Medicine

## 2019-04-16 ENCOUNTER — Ambulatory Visit (INDEPENDENT_AMBULATORY_CARE_PROVIDER_SITE_OTHER): Payer: Medicare PPO | Admitting: Family Medicine

## 2019-04-16 VITALS — BP 114/60 | HR 97 | Temp 97.2°F | Resp 12 | Ht 62.0 in | Wt 172.0 lb

## 2019-04-16 DIAGNOSIS — F419 Anxiety disorder, unspecified: Secondary | ICD-10-CM | POA: Diagnosis not present

## 2019-04-16 DIAGNOSIS — I10 Essential (primary) hypertension: Secondary | ICD-10-CM

## 2019-04-16 DIAGNOSIS — Z0000 Encounter for general adult medical examination without abnormal findings: Secondary | ICD-10-CM

## 2019-04-16 DIAGNOSIS — L219 Seborrheic dermatitis, unspecified: Secondary | ICD-10-CM

## 2019-04-16 DIAGNOSIS — E785 Hyperlipidemia, unspecified: Secondary | ICD-10-CM

## 2019-04-16 LAB — COMPREHENSIVE METABOLIC PANEL
ALT: 22 U/L (ref 0–35)
AST: 21 U/L (ref 0–37)
Albumin: 4 g/dL (ref 3.5–5.2)
Alkaline Phosphatase: 91 U/L (ref 39–117)
BUN: 12 mg/dL (ref 6–23)
CO2: 33 mEq/L — ABNORMAL HIGH (ref 19–32)
Calcium: 9.7 mg/dL (ref 8.4–10.5)
Chloride: 102 mEq/L (ref 96–112)
Creatinine, Ser: 0.84 mg/dL (ref 0.40–1.20)
GFR: 66.18 mL/min (ref >60.00)
Glucose, Bld: 87 mg/dL (ref 70–99)
Potassium: 4.5 mEq/L (ref 3.5–5.1)
Sodium: 142 mEq/L (ref 135–145)
Total Bilirubin: 0.5 mg/dL (ref 0.2–1.2)
Total Protein: 6.5 g/dL (ref 6.0–8.3)

## 2019-04-16 LAB — LIPID PANEL
Cholesterol: 192 mg/dL (ref 0–200)
HDL: 41.8 mg/dL (ref >39.00)
LDL Cholesterol: 118 mg/dL — ABNORMAL HIGH (ref 0–99)
NonHDL: 150.43
Total CHOL/HDL Ratio: 5
Triglycerides: 160 mg/dL — ABNORMAL HIGH (ref 0.0–149.0)
VLDL: 32 mg/dL (ref 0.0–40.0)

## 2019-04-16 MED ORDER — DIAZEPAM 5 MG PO TABS: 2.5000 mg | ORAL_TABLET | Freq: Two times a day (BID) | ORAL | 2 refills | Status: DC | PRN

## 2019-04-16 MED ORDER — TRIAMCINOLONE ACETONIDE 0.1 % EX CREA: 1.0000 "application " | TOPICAL_CREAM | Freq: Two times a day (BID) | CUTANEOUS | 2 refills | Status: DC | PRN

## 2019-04-16 MED ORDER — KETOCONAZOLE 2 % EX SHAM: 1.0000 "application " | MEDICATED_SHAMPOO | CUTANEOUS | 1 refills | Status: DC

## 2019-04-16 MED ORDER — LISINOPRIL 10 MG PO TABS: 10.0000 mg | ORAL_TABLET | Freq: Two times a day (BID) | ORAL | 2 refills | Status: DC

## 2019-04-16 MED ORDER — HYDROCHLOROTHIAZIDE 12.5 MG PO TABS: 12.5000 mg | ORAL_TABLET | Freq: Every day | ORAL | 2 refills | Status: DC

## 2019-04-16 NOTE — Assessment & Plan Note (Signed)
Problem is stable. No changes in Valium dose. Follow-up in 6 months.

## 2019-04-16 NOTE — Patient Instructions (Signed)
It was nice to see you.  Routine general medical examination at a health care facility  Hypertension, essential, benign - Plan: lisinopril (ZESTRIL) 10 MG tablet, hydrochlorothiazide (HYDRODIURIL) 12.5 MG tablet, Comprehensive metabolic panel  Anxiety disorder, unspecified type - Plan: diazepam (VALIUM) 5 MG tablet  Hyperlipidemia, unspecified hyperlipidemia type - Plan: Comprehensive metabolic panel, Lipid panel  No changes today. Consider getting COVID 19 vaccine.  Please be sure medication list is accurate. If a new problem present, please set up appointment sooner than planned today.  A few tips:  -As we age balance is not as good as it was, so there is a higher risks for falls. Please remove small rugs and furniture that is "in your way" and could increase the risk of falls. Stretching exercises may help with fall prevention: Yoga and Tai Chi are some examples. Low impact exercise is better, so you are not very achy the next day.  -Sun screen and avoidance of direct sun light recommended. Caution with dehydration, if working outdoors be sure to drink enough fluids.  - Some medications are not safe as we age, increases the risk of side effects and can potentially interact with other medication you are also taken;  including some of over the counter medications. Be sure to let me know when you start a new medication even if it is a dietary/vitamin supplement.   -Healthy diet low in red meet/animal fat and sugar + regular physical activity is recommended.

## 2019-04-16 NOTE — Assessment & Plan Note (Signed)
BP has been otherwise well controlled. No changes in current management. We discussed some side effects of antihypertensive medications. Eye exam is current. Continue low-salt diet and monitoring BP regularly.

## 2019-04-16 NOTE — Assessment & Plan Note (Signed)
For now she is not interested in starting medication, we had discussed benefits in the past. Continue unknown pharmacologic treatment. Further recommendation will be given according to lipid panel results.

## 2019-04-16 NOTE — Progress Notes (Signed)
HPI:   [name] is a 74 y.o. female, who is here today with her husband for her routine physical. Last CPE: 03/13/2018.  Regular exercise 3 or more time per week: She has not been consistent, she is active doing chores around her house. Following a healthy diet: Yes, plenty of vegetables and low meat intake. She lives with her husband.  Independent ADL's and IADL's.  Chronic medical problems: Hyperlipidemia, hypertension, SK, anxiety, and CVA.   There is no immunization history on file for this patient.  Mammogram: 03/02/2008. Colonoscopy: Never DEXA: Never  Hep C screening: Not interested at this time.  HTN: HCTZ was discontinued and Lisinopril dose increased because the former one was causing acne. She resumed HCTZ 12.5 mg because BP was elevated. She is now on HCTZ 12.5 mg and Lisinopril 10 mg bid. Monitoring BP at home, most < 140/90.  Lab Results  Component Value Date   CREATININE 0.75 09/14/2018   BUN 11 09/14/2018   NA 141 09/14/2018   K 4.3 09/14/2018   CL 103 09/14/2018   CO2 29 09/14/2018   HLD: She is on low fat diet.  Lab Results  Component Value Date   CHOL 200 09/14/2018   HDL 49.70 09/14/2018   LDLCALC 115 (H) 09/14/2018   TRIG 173.0 (H) 09/14/2018   CHOLHDL 4 09/14/2018   Anxiety: She is on Valium 5 mg 1/2-1 tab daily as needed. Tolerating well. No side effects reported.  C/O weeks of pruritic rash on occipital scalp,hair line.  No new shampoo or hair product. She has tried Head and Shoulder shampoo. Rash is not tender.  Review of Systems  Constitutional: Negative for appetite change, fatigue and fever.  HENT: Negative for dental problem, hearing loss, mouth sores and sore throat.   Eyes: Negative for redness and visual disturbance.  Respiratory: Negative for cough, shortness of breath and wheezing.   Cardiovascular: Negative for chest pain and leg swelling.  Gastrointestinal: Negative for abdominal pain, nausea and  vomiting.       No changes in bowel habits.  Endocrine: Negative for cold intolerance, heat intolerance, polydipsia, polyphagia and polyuria.  Genitourinary: Negative for decreased urine volume, dysuria, hematuria, vaginal bleeding and vaginal discharge.  Musculoskeletal: Negative for gait problem and myalgias.  Skin: Negative for color change and rash.  Allergic/Immunologic: Negative for environmental allergies.  Neurological: Negative for syncope, weakness and headaches.  Hematological: Negative for adenopathy. Does not bruise/bleed easily.  Psychiatric/Behavioral: Negative for confusion. The patient is nervous/anxious.   All other systems reviewed and are negative.   Current Outpatient Medications on File Prior to Visit  Medication Sig Dispense Refill  . aspirin EC 81 MG tablet Take 81 mg by mouth daily.    . TURMERIC PO Take 1 tablet by mouth daily.     No current facility-administered medications on file prior to visit.    Past Medical History:  Diagnosis Date  . Anxiety   . Hyperlipidemia   . Hypertension   . Stroke [facility])     Past Surgical History:  Procedure Laterality Date  . APPENDECTOMY    . TONSILLECTOMY      Allergies  Allergen Reactions  . Erythromycin     IV    Family History  Problem Relation Age of Onset  . Arthritis Mother   . Heart disease Mother   . Arthritis Father   . Heart disease Father   . Diabetes Maternal Grandfather     Social History  Socioeconomic History  . Marital status: Married    Spouse name: Danielle Trevino  . Number of children: 0  . Years of education: 16  . Highest education level: Bachelor's degree (e.g., BA, AB, BS)  Occupational History  . Occupation: retired  Tobacco Use  . Smoking status: Former Smoker    Quit date: 05/19/2017    Years since quitting: 1.9  . Smokeless tobacco: Never Used  Substance and Sexual Activity  . Alcohol use: No  . Drug use: No  . Sexual activity: Not on file  Other Topics Concern  . Not on  file  Social History Narrative   Patient is right-handed. She lives with her husband in a one story house. She avoids caffeine. She does not exercise. She is a retired Tourist information centre manager.   Social Determinants of Health   Financial Resource Strain: Low Risk   . Difficulty of Paying Living Expenses: Not hard at all  Food Insecurity: No Food Insecurity  . Worried About Charity fundraiser in the Last Year: Never true  . Ran Out of Food in the Last Year: Never true  Transportation Needs: No Transportation Needs  . Lack of Transportation (Medical): No  . Lack of Transportation (Non-Medical): No  Physical Activity: Inactive  . Days of Exercise per Week: 0 days  . Minutes of Exercise per Session: 0 min  Stress: No Stress Concern Present  . Feeling of Stress : Only a little  Social Connections: Not Isolated  . Frequency of Communication with Friends and Family: More than three times a week  . Frequency of Social Gatherings with Friends and Family: Once a week  . Attends Religious Services: More than 4 times per year  . Active Member of Clubs or Organizations: Yes  . Attends Archivist Meetings: More than 4 times per year  . Marital Status: Married    Vitals:   04/16/19 0851  BP: 114/60  Pulse: 97  Resp: 12  Temp: (!) 97.2 F (36.2 C)  SpO2: 97%   Body mass index is 31.46 kg/m.   Wt Readings from Last 3 Encounters:  04/16/19 172 lb (78 kg)  03/17/19 170 lb (77.1 kg)  09/14/18 171 lb 12.8 oz (77.9 kg)   Physical Exam  Nursing note and vitals reviewed. Constitutional: She is oriented to person, place, and time. She appears well-developed. No distress.  HENT:  Head: Normocephalic and atraumatic.  Right Ear: Tympanic membrane, external ear and ear canal normal. Decreased hearing is noted.  Left Ear: Tympanic membrane, external ear and ear canal normal. Decreased hearing is noted.  Mouth/Throat: Uvula is midline, oropharynx is clear and moist and mucous membranes are normal.   Hearing aids,bilateral.  Eyes: Pupils are equal, round, and reactive to light. Conjunctivae and EOM are normal.  Neck: No tracheal deviation present.  Cardiovascular: Normal rate and regular rhythm.  No murmur heard. Pulses:      Dorsalis pedis pulses are 2+ on the right side and 2+ on the left side.  Respiratory: Effort normal and breath sounds normal. No respiratory distress.  GI: Soft. She exhibits no mass. There is no hepatomegaly. There is no abdominal tenderness.  Musculoskeletal:        General: No edema.     Comments: No major deformity or signs of synovitis appreciated.  Lymphadenopathy:    She has no cervical adenopathy.       Right: No supraclavicular adenopathy present.       Left: No supraclavicular adenopathy present.  Neurological: She is alert and oriented to person, place, and time. She has normal strength. No cranial nerve deficit. Coordination and gait normal.  Reflex Scores:      Bicep reflexes are 2+ on the right side and 2+ on the left side.      Patellar reflexes are 2+ on the right side and 2+ on the left side. Skin: Skin is warm. Rash (Lower occipital scalp, along hair line.) noted. Rash is maculopapular. No erythema.  Psychiatric: She has a normal mood and affect.  Well groomed, good eye contact.   ASSESSMENT AND PLAN:  Danielle Trevino was here today annual physical examination.   Orders Placed This Encounter  Procedures  . Comprehensive metabolic panel  . Lipid panel    Lab Results  Component Value Date   CHOL 192 04/16/2019   HDL 41.80 04/16/2019   LDLCALC 118 (H) 04/16/2019   TRIG 160.0 (H) 04/16/2019   CHOLHDL 5 04/16/2019    Lab Results  Component Value Date   CREATININE 0.84 04/16/2019   BUN 12 04/16/2019   NA 142 04/16/2019   K 4.5 04/16/2019   CL 102 04/16/2019   CO2 33 (H) 04/16/2019   Lab Results  Component Value Date   ALT 22 04/16/2019   AST 21 04/16/2019   ALKPHOS 91 04/16/2019   BILITOT 0.5 04/16/2019    Routine  general medical examination at a health care facility We discussed the importance of regular physical activity and healthy diet for prevention of chronic illness and/or complications. Preventive guidelines reviewed. Vaccination up to date. She is not interested in preventive screening test. Ca++ and vit D supplementation recommended. Fall precautions. Next CPE in a year.  Seborrheic dermatitis of scalp Educated about Dx,prognosis,and treatment options. If not better, we will consider derma referral.  -     ketoconazole (NIZORAL) 2 % shampoo; Apply 1 application topically 2 (two) times a week. -     Discontinue: triamcinolone cream (KENALOG) 0.1 %; Apply 1 application topically 2 (two) times daily as needed. -     triamcinolone cream (KENALOG) 0.1 %; Apply 1 application topically 2 (two) times daily as needed.  Hypertension, essential, benign BP has been otherwise well controlled. No changes in current management. We discussed some side effects of antihypertensive medications. Eye exam is current. Continue low-salt diet and monitoring BP regularly.  Anxiety disorder, unspecified Problem is stable. No changes in Valium dose. Follow-up in 6 months.  Hyperlipidemia For now she is not interested in starting medication, we had discussed benefits in the past. Continue unknown pharmacologic treatment. Further recommendation will be given according to lipid panel results.   Return in 6 months (on 10/14/2019) for HTN.  Allenmichael Mcpartlin G. [place], MD  [facility]. [place] office.

## 2019-04-18 ENCOUNTER — Encounter: Payer: Self-pay | Admitting: Family Medicine

## 2019-06-08 ENCOUNTER — Other Ambulatory Visit: Payer: Self-pay

## 2019-06-09 ENCOUNTER — Ambulatory Visit (INDEPENDENT_AMBULATORY_CARE_PROVIDER_SITE_OTHER): Payer: Medicare PPO | Admitting: Family Medicine

## 2019-06-09 ENCOUNTER — Encounter: Payer: Self-pay | Admitting: Family Medicine

## 2019-06-09 VITALS — BP 130/80 | HR 74 | Temp 97.8°F | Resp 12 | Ht 62.0 in | Wt 171.1 lb

## 2019-06-09 DIAGNOSIS — I10 Essential (primary) hypertension: Secondary | ICD-10-CM | POA: Diagnosis not present

## 2019-06-09 DIAGNOSIS — H539 Unspecified visual disturbance: Secondary | ICD-10-CM

## 2019-06-09 DIAGNOSIS — F419 Anxiety disorder, unspecified: Secondary | ICD-10-CM

## 2019-06-09 DIAGNOSIS — G43109 Migraine with aura, not intractable, without status migrainosus: Secondary | ICD-10-CM

## 2019-06-09 MED ORDER — TOPIRAMATE 25 MG PO TABS: 25.0000 mg | ORAL_TABLET | Freq: Every day | ORAL | 1 refills | Status: DC

## 2019-06-09 NOTE — Progress Notes (Addendum)
ACUTE VISIT Chief Complaint  Patient presents with  . Migraine    Ocular migraine issues that started on Thursday  . Fluctuating blood pressure    b/p fluctuating that started on Thursday   HPI: [name] is a 74 y.o. female, who is here today with her husband with above concern. Problem has been intermittent but since 06/03/2019 she has had about 2 episodes of ocular aura per day. It is involving both eyes at the time. No diplopia,eye pain,or conjunctival erythema.  Oval or rounded shapes in visual field that move and affect vision depending of localization.  It lasts about 10-15 min. No residual symptoms. Negative for fever, chills, headache, photophobia, dysphagia, N/V, or focal weakness.  She has had similar symptoms in the past, initially it was associated with headache,06/2027.  Brain MRI done on 07/26/2017 show: 1. Acute small vessel infarct in the right cerebellum, right PICA territory. No associated hemorrhage or mass effect. 2. Otherwise normal for age noncontrast MRI appearance of the brain. Ocular symptoms thought not to be associated with CVA.  Evaluated by neurologist, Dr Ricka Burdock on 07/30/17.  BP seems to be intermittent elevated, most during episodes of visual aura. Problem is aggravated anxiety. She has been under some stress, crying at times.Closed friends have died unexpectedly, this was very hard for her.  She takes Valium 5 mg 1/2 tablet and it seems to help her relax.  Currently she is on lisinopril 10 mg daily and HCTZ 12.5 mg daily. Negative for CP, dyspnea, palpitations, gross hematuria, decreased urine output, or edema.   Lab Results  Component Value Date   CREATININE 0.84 04/16/2019   BUN 12 04/16/2019   NA 142 04/16/2019   K 4.5 04/16/2019   CL 102 04/16/2019   CO2 33 (H) 04/16/2019   Review of Systems  Constitutional: Negative for activity change, appetite change and fatigue.  HENT: Negative for mouth sores, nosebleeds and sore  throat.   Eyes: Negative for discharge.  Respiratory: Negative for cough and wheezing.   Gastrointestinal: Negative for abdominal pain.       Negative for changes in bowel habits.  Musculoskeletal: Negative for gait problem and myalgias.  Neurological: Negative for seizures, syncope and facial asymmetry.  Psychiatric/Behavioral: Negative for confusion. The patient is nervous/anxious.   Rest see pertinent positives and negatives per HPI.  Current Outpatient Medications on File Prior to Visit  Medication Sig Dispense Refill  . aspirin EC 81 MG tablet Take 81 mg by mouth daily.    . diazepam (VALIUM) 5 MG tablet Take 0.5-1 tablets (2.5-5 mg total) by mouth every 12 (twelve) hours as needed for anxiety. 20 tablet 2  . hydrochlorothiazide (HYDRODIURIL) 12.5 MG tablet Take 1 tablet (12.5 mg total) by mouth daily. 90 tablet 2  . ketoconazole (NIZORAL) 2 % shampoo Apply 1 application topically 2 (two) times a week. 120 mL 1  . lisinopril (ZESTRIL) 10 MG tablet Take 1 tablet (10 mg total) by mouth 2 (two) times daily. 180 tablet 2  . triamcinolone cream (KENALOG) 0.1 % Apply 1 application topically 2 (two) times daily as needed. 30 g 2  . TURMERIC PO Take 1 tablet by mouth daily.     No current facility-administered medications on file prior to visit.    Past Medical History:  Diagnosis Date  . Anxiety   . Hyperlipidemia   . Hypertension   . Stroke [facility])    Allergies  Allergen Reactions  . Erythromycin  IV    Social History   Socioeconomic History  . Marital status: Married    Spouse name: Amada Jupiter  . Number of children: 0  . Years of education: 16  . Highest education level: Bachelor's degree (e.g., BA, AB, BS)  Occupational History  . Occupation: retired  Tobacco Use  . Smoking status: Former Smoker    Quit date: 05/19/2017    Years since quitting: 2.0  . Smokeless tobacco: Never Used  Substance and Sexual Activity  . Alcohol use: No  . Drug use: No  . Sexual activity: Not  on file  Other Topics Concern  . Not on file  Social History Narrative   Patient is right-handed. She lives with her husband in a one story house. She avoids caffeine. She does not exercise. She is a retired Programmer, systems.   Social Determinants of Health   Financial Resource Strain: Low Risk   . Difficulty of Paying Living Expenses: Not hard at all  Food Insecurity: No Food Insecurity  . Worried About Programme researcher, broadcasting/film/video in the Last Year: Never true  . Ran Out of Food in the Last Year: Never true  Transportation Needs: No Transportation Needs  . Lack of Transportation (Medical): No  . Lack of Transportation (Non-Medical): No  Physical Activity: Inactive  . Days of Exercise per Week: 0 days  . Minutes of Exercise per Session: 0 min  Stress: No Stress Concern Present  . Feeling of Stress : Only a little  Social Connections: Not Isolated  . Frequency of Communication with Friends and Family: More than three times a week  . Frequency of Social Gatherings with Friends and Family: Once a week  . Attends Religious Services: More than 4 times per year  . Active Member of Clubs or Organizations: Yes  . Attends Banker Meetings: More than 4 times per year  . Marital Status: Married    Vitals:   06/09/19 1128  BP: 130/80  Pulse: 74  Resp: 12  Temp: 97.8 F (36.6 C)  SpO2: 96%   Body mass index is 31.3 kg/m.  Physical Exam  Nursing note and vitals reviewed. Constitutional: She is oriented to person, place, and time. She appears well-developed. No distress.  HENT:  Head: Normocephalic and atraumatic.  Mouth/Throat: Oropharynx is clear and moist and mucous membranes are normal.  Eyes: Pupils are equal, round, and reactive to light. Conjunctivae and EOM are normal.  Fundoscopic exam:      The right eye shows no AV nicking, no exudate and no hemorrhage.       The left eye shows no AV nicking, no exudate and no hemorrhage.  Cardiovascular: Normal rate and regular rhythm.   No murmur heard. Respiratory: Effort normal and breath sounds normal. No respiratory distress.  GI: Soft. She exhibits no mass. There is no hepatomegaly. There is no abdominal tenderness.  Musculoskeletal:        General: No edema.  Lymphadenopathy:    She has no cervical adenopathy.  Neurological: She is alert and oriented to person, place, and time. She has normal strength. No cranial nerve deficit. Gait normal.  Reflex Scores:      Bicep reflexes are 2+ on the right side and 2+ on the left side.      Patellar reflexes are 2+ on the right side and 2+ on the left side. Skin: Skin is warm. No rash noted. No erythema.  Psychiatric: She has a normal mood and affect.  Well groomed,  good eye contact.    ASSESSMENT AND PLAN:  [name] was seen today for migraine and fluctuating blood pressure.  Diagnoses and all orders for this visit:  Ocular migraine Neurologic examination today does not suggest a serious process. We discussed differential dx's, including CVA. For now she prefers to hold on brain imaging. She has an appt with her eye care provider tomorrow. Low dose Topamax may help.  She is not sure if she wants to take prophylactic treatment but wants me to send Rx in case she decides to do so. Instructed about warning signs,  -     topiramate (TOPAMAX) 25 MG tablet; Take 1 tablet (25 mg total) by mouth at bedtime.   Hypertension, essential, benign For now no changes in current management. Continue monitoring BP regularly. Continue low-salt diet. Follow-up in 2 months.  Anxiety disorder, unspecified Mildly worse for the past few days. Continue Valium 5 mg 1/2 to 1 tablet twice daily as needed. She has not tolerated SSRIs in the past. Follow-up in 8 weeks.   Return in about 8 weeks (around 08/04/2019) for Visual aura..   Evangelene Vora G. [place], MD  [facility]. [place] office.   A few things to remember from today's visit:  For now no changes in the  medication for blood pressure. Continue checking blood pressure at home. Keep appointment with eye care provider. Monitor for new symptoms. Please let me know if you want to have the brain MRI.    Please be sure medication list is accurate. If a new problem present, please set up appointment sooner than planned today

## 2019-06-09 NOTE — Assessment & Plan Note (Signed)
For now no changes in current management. Continue monitoring BP regularly. Continue low-salt diet. Follow-up in 2 months.

## 2019-06-09 NOTE — Assessment & Plan Note (Signed)
Mildly worse for the past few days. Continue Valium 5 mg 1/2 to 1 tablet twice daily as needed. She has not tolerated SSRIs in the past. Follow-up in 8 weeks.

## 2019-06-09 NOTE — Patient Instructions (Signed)
A few things to remember from today's visit:   For now no changes in the medication for blood pressure. Continue checking blood pressure at home. Keep appointment with eye care provider. Monitor for new symptoms. Please let me know if you want to have the brain MRI.    Please be sure medication list is accurate. If a new problem present, please set up appointment sooner than planned today.

## 2019-06-10 DIAGNOSIS — H43813 Vitreous degeneration, bilateral: Secondary | ICD-10-CM | POA: Diagnosis not present

## 2019-06-10 DIAGNOSIS — G43801 Other migraine, not intractable, with status migrainosus: Secondary | ICD-10-CM | POA: Diagnosis not present

## 2019-06-11 ENCOUNTER — Encounter: Payer: Self-pay | Admitting: Family Medicine

## 2019-06-29 DIAGNOSIS — H2513 Age-related nuclear cataract, bilateral: Secondary | ICD-10-CM | POA: Diagnosis not present

## 2019-07-01 ENCOUNTER — Other Ambulatory Visit: Payer: Self-pay | Admitting: Family Medicine

## 2019-07-01 DIAGNOSIS — G43109 Migraine with aura, not intractable, without status migrainosus: Secondary | ICD-10-CM

## 2019-07-28 ENCOUNTER — Ambulatory Visit: Payer: Medicare PPO | Admitting: Family Medicine

## 2019-08-04 ENCOUNTER — Ambulatory Visit: Payer: Medicare PPO | Admitting: Family Medicine

## 2019-10-14 ENCOUNTER — Other Ambulatory Visit: Payer: Self-pay

## 2019-10-15 ENCOUNTER — Ambulatory Visit: Payer: Medicare PPO | Admitting: Family Medicine

## 2019-10-15 ENCOUNTER — Encounter: Payer: Self-pay | Admitting: Family Medicine

## 2019-10-15 VITALS — BP 136/80 | HR 88 | Resp 16 | Ht 62.0 in | Wt 170.4 lb

## 2019-10-15 DIAGNOSIS — G43109 Migraine with aura, not intractable, without status migrainosus: Secondary | ICD-10-CM

## 2019-10-15 DIAGNOSIS — F419 Anxiety disorder, unspecified: Secondary | ICD-10-CM | POA: Diagnosis not present

## 2019-10-15 DIAGNOSIS — I10 Essential (primary) hypertension: Secondary | ICD-10-CM

## 2019-10-15 NOTE — Progress Notes (Signed)
HPI: Danielle Trevino is a 74 y.o. female, who is here today with her husband for 6 months follow up.   She was last seen on 06/09/19. Ocular migraine: Last visit Topiramate was recommended. She did not take medication. Problem improved. She has not had episodes sine her last visit.  She has had ophthalmologic evaluation. CVA: She has followed with neurologist.  Brain MRI on 07/26/17: 1. Acute small vessel infarct in the right cerebellum, right PICA territory. No associated hemorrhage or mass effect. 2. Otherwise normal for age noncontrast MRI appearance of the brain.  HTN: BP has been well controlled. She is on Lisinopril 10 mg bid and HCTZ 25 mg daily. Home BP's: Most low 110's-120's/60-70's  Negative for headache, chest pain, dyspnea, palpitation,focal weakness, or unusual edema.  HCTZ is causing skin rash on face, acne like lesions.  Lab Results  Component Value Date   CREATININE 0.84 04/16/2019   BUN 12 04/16/2019   NA 142 04/16/2019   K 4.5 04/16/2019   CL 102 04/16/2019   CO2 33 (H) 04/16/2019   Anxiety has also improved. She has Diazepam 5 mg to take bid if needed. She needs medication seldom. No side effects. Negative for depression.  Review of Systems  Constitutional: Negative for activity change, appetite change, fatigue and fever.  HENT: Negative for mouth sores, nosebleeds, sore throat and trouble swallowing.   Eyes: Negative for redness and visual disturbance.  Respiratory: Negative for cough and wheezing.   Gastrointestinal: Negative for abdominal pain, nausea and vomiting.       Negative for changes in bowel habits.  Genitourinary: Negative for decreased urine volume and hematuria.  Musculoskeletal: Negative for gait problem and myalgias.  Neurological: Negative for syncope and facial asymmetry.  Psychiatric/Behavioral: Negative for confusion and sleep disturbance.  Rest of ROS, see pertinent positives sand negatives in HPI  Current Outpatient  Medications on File Prior to Visit  Medication Sig Dispense Refill  . aspirin EC 81 MG tablet Take 81 mg by mouth daily.    . diazepam (VALIUM) 5 MG tablet Take 0.5-1 tablets (2.5-5 mg total) by mouth every 12 (twelve) hours as needed for anxiety. 20 tablet 2  . ketoconazole (NIZORAL) 2 % shampoo Apply 1 application topically 2 (two) times a week. 120 mL 1  . lisinopril (ZESTRIL) 10 MG tablet Take 1 tablet (10 mg total) by mouth 2 (two) times daily. 180 tablet 2  . triamcinolone cream (KENALOG) 0.1 % Apply 1 application topically 2 (two) times daily as needed. 30 g 2  . TURMERIC PO Take 1 tablet by mouth daily.     No current facility-administered medications on file prior to visit.    Past Medical History:  Diagnosis Date  . Anxiety   . Hyperlipidemia   . Hypertension   . Stroke [facility])    Allergies  Allergen Reactions  . Erythromycin     IV    Social History   Socioeconomic History  . Marital status: Married    Spouse name: Amada Jupiter  . Number of children: 0  . Years of education: 16  . Highest education level: Bachelor's degree (e.g., BA, AB, BS)  Occupational History  . Occupation: retired  Tobacco Use  . Smoking status: Former Smoker    Quit date: 05/19/2017    Years since quitting: 2.4  . Smokeless tobacco: Never Used  Substance and Sexual Activity  . Alcohol use: No  . Drug use: No  . Sexual activity: Not on  file  Other Topics Concern  . Not on file  Social History Narrative   Patient is right-handed. She lives with her husband in a one story house. She avoids caffeine. She does not exercise. She is a retired Programmer, systems.   Social Determinants of Health   Financial Resource Strain: Low Risk   . Difficulty of Paying Living Expenses: Not hard at all  Food Insecurity: No Food Insecurity  . Worried About Programme researcher, broadcasting/film/video in the Last Year: Never true  . Ran Out of Food in the Last Year: Never true  Transportation Needs: No Transportation Needs  . Lack of  Transportation (Medical): No  . Lack of Transportation (Non-Medical): No  Physical Activity: Inactive  . Days of Exercise per Week: 0 days  . Minutes of Exercise per Session: 0 min  Stress: No Stress Concern Present  . Feeling of Stress : Only a little  Social Connections: Socially Integrated  . Frequency of Communication with Friends and Family: More than three times a week  . Frequency of Social Gatherings with Friends and Family: Once a week  . Attends Religious Services: More than 4 times per year  . Active Member of Clubs or Organizations: Yes  . Attends Banker Meetings: More than 4 times per year  . Marital Status: Married    Vitals:   10/15/19 1018  BP: 136/80  Pulse: 88  Resp: 16  SpO2: 96%   Body mass index is 31.16 kg/m.  Physical Exam Vitals and nursing note reviewed.  Constitutional:      General: She is not in acute distress.    Appearance: She is well-developed.  HENT:     Head: Normocephalic and atraumatic.     Mouth/Throat:     Mouth: Mucous membranes are moist.     Pharynx: Oropharynx is clear.  Eyes:     Conjunctiva/sclera: Conjunctivae normal.     Pupils: Pupils are equal, round, and reactive to light.  Cardiovascular:     Rate and Rhythm: Normal rate and regular rhythm.     Pulses:          Dorsalis pedis pulses are 2+ on the right side and 2+ on the left side.     Heart sounds: No murmur heard.   Pulmonary:     Effort: Pulmonary effort is normal. No respiratory distress.     Breath sounds: Normal breath sounds.  Abdominal:     Palpations: Abdomen is soft. There is no hepatomegaly or mass.     Tenderness: There is no abdominal tenderness.  Lymphadenopathy:     Cervical: No cervical adenopathy.  Skin:    General: Skin is warm.     Findings: No erythema or rash.  Neurological:     General: No focal deficit present.     Mental Status: She is alert and oriented to person, place, and time.     Cranial Nerves: No cranial nerve  deficit.     Gait: Gait normal.  Psychiatric:        Mood and Affect: Mood and affect normal.     Comments: Well groomed, good eye contact.    ASSESSMENT AND PLAN:  Danielle Trevino was seen today for 6 months follow-up.  Orders Placed This Encounter  Procedures  . BASIC METABOLIC PANEL WITH GFR   Lab Results  Component Value Date   CREATININE 0.87 10/15/2019   BUN 16 10/15/2019   NA 142 10/15/2019   K 4.1 10/15/2019  CL 105 10/15/2019   CO2 31 10/15/2019   Ocular migraine Problem has improved. We will continue monitoring. I do not think brain imaging is needed at this time.  Hypertension, essential, benign BP is much better controlled. Because HCTZ is causing acne, we can try stopping it. Continue Lisinopril 10 mg bid and low salt diet. Continue monitoring BP regularly.  Anxiety disorder, unspecified type Problem is well controlled, stable. She has tolerated medication well. Continue Diazepam 5 mg bid if needed.   Return in about 6 months (around 04/16/2020) for cpe.   Blayde Bacigalupi G. [place], MD  [facility]. [place] office.  A few things to remember from today's visit:  Stop hydrochlorothiazide and continue monitoring blood pressures.  If you need refills please call your pharmacy. Do not use My Chart to request refills or for acute issues that need immediate attention.    Please be sure medication list is accurate. If a new problem present, please set up appointment sooner than planned today.

## 2019-10-15 NOTE — Patient Instructions (Addendum)
A few things to remember from today's visit:  Stop hydrochlorothiazide and continue monitoring blood pressures.  If you need refills please call your pharmacy. Do not use My Chart to request refills or for acute issues that need immediate attention.    Please be sure medication list is accurate. If a new problem present, please set up appointment sooner than planned today.

## 2019-10-16 LAB — BASIC METABOLIC PANEL WITH GFR
BUN: 16 mg/dL (ref 7–25)
CO2: 31 mmol/L (ref 20–32)
Calcium: 9.2 mg/dL (ref 8.6–10.4)
Chloride: 105 mmol/L (ref 98–110)
Creat: 0.87 mg/dL (ref 0.60–0.93)
GFR, Est African American: 76 mL/min/{1.73_m2} (ref >=60)
GFR, Est Non African American: 65 mL/min/{1.73_m2} (ref >=60)
Glucose, Bld: 85 mg/dL (ref 65–99)
Potassium: 4.1 mmol/L (ref 3.5–5.3)
Sodium: 142 mmol/L (ref 135–146)

## 2019-10-17 MED ORDER — LISINOPRIL 10 MG PO TABS: 10.0000 mg | ORAL_TABLET | Freq: Two times a day (BID) | ORAL | 2 refills | Status: DC

## 2019-10-21 ENCOUNTER — Encounter: Payer: Self-pay | Admitting: Family Medicine

## 2019-10-28 ENCOUNTER — Encounter: Payer: Self-pay | Admitting: Family Medicine

## 2019-10-29 MED ORDER — HYDROCHLOROTHIAZIDE 12.5 MG PO TABS: 12.5000 mg | ORAL_TABLET | Freq: Every day | ORAL | 3 refills | Status: DC

## 2020-03-17 ENCOUNTER — Ambulatory Visit: Payer: Medicare PPO

## 2020-03-17 DIAGNOSIS — Z0000 Encounter for general adult medical examination without abnormal findings: Secondary | ICD-10-CM

## 2020-03-17 NOTE — Progress Notes (Signed)
Subjective:   Danielle Trevino is a 75 y.o. female who presents for Medicare Annual (Subsequent) preventive examination.  I connected with Danielle Trevino  today by telephone and verified that I am speaking with the correct person using two identifiers. Location patient: home Location provider: work Persons participating in the virtual visit: patient, provider.   I discussed the limitations, risks, security and privacy concerns of performing an evaluation and management service by telephone and the availability of in person appointments. I also discussed with the patient that there may be a patient responsible charge related to this service. The patient expressed understanding and verbally consented to this telephonic visit.    Interactive audio and video telecommunications were attempted between this provider and patient, however failed, due to patient having technical difficulties OR patient did not have access to video capability.  We continued and completed visit with audio only.      Review of Systems    N/A  Cardiac Risk Factors include: advanced age (>61men, >74 women)     Objective:    Today's Vitals   There is no height or weight on file to calculate BMI.  Advanced Directives 03/17/2020 03/17/2019  Does Patient Have a Medical Advance Directive? Yes Yes  Type of Estate agent of [place];Living will Healthcare Power of [place];Living will  Does patient want to make changes to medical advance directive? No - Patient declined No - Patient declined  Copy of Healthcare Power of Attorney in Chart? No - copy requested No - copy requested    Current Medications (verified) Outpatient Encounter Medications as of 03/17/2020  Medication Sig  . aspirin EC 81 MG tablet Take 81 mg by mouth daily.  . diazepam (VALIUM) 5 MG tablet Take 0.5-1 tablets (2.5-5 mg total) by mouth every 12 (twelve) hours as needed for anxiety.  . hydrochlorothiazide (HYDRODIURIL) 12.5 MG  tablet Take 1 tablet (12.5 mg total) by mouth daily.  Marland Kitchen lisinopril (ZESTRIL) 10 MG tablet Take 1 tablet (10 mg total) by mouth 2 (two) times daily.  Marland Kitchen triamcinolone cream (KENALOG) 0.1 % Apply 1 application topically 2 (two) times daily as needed.  . TURMERIC PO Take 1 tablet by mouth daily.  . [DISCONTINUED] ketoconazole (NIZORAL) 2 % shampoo Apply 1 application topically 2 (two) times a week.   No facility-administered encounter medications on file as of 03/17/2020.    Allergies (verified) Erythromycin   History: Past Medical History:  Diagnosis Date  . Anxiety   . Hyperlipidemia   . Hypertension   . Stroke [facility])    Past Surgical History:  Procedure Laterality Date  . APPENDECTOMY    . TONSILLECTOMY     Family History  Problem Relation Age of Onset  . Arthritis Mother   . Heart disease Mother   . Arthritis Father   . Heart disease Father   . Diabetes Maternal Grandfather    Social History   Socioeconomic History  . Marital status: Married    Spouse name: Danielle Trevino  . Number of children: 0  . Years of education: 16  . Highest education level: Bachelor's degree (e.g., BA, AB, BS)  Occupational History  . Occupation: retired  Tobacco Use  . Smoking status: Former Smoker    Quit date: 05/19/2017    Years since quitting: 2.8  . Smokeless tobacco: Never Used  Substance and Sexual Activity  . Alcohol use: No  . Drug use: No  . Sexual activity: Not on file  Other Topics Concern  .  Not on file  Social History Narrative   Patient is right-handed. She lives with her husband in a one story house. She avoids caffeine. She does not exercise. She is a retired Programmer, systems.   Social Determinants of Health   Financial Resource Strain: Low Risk   . Difficulty of Paying Living Expenses: Not hard at all  Food Insecurity: No Food Insecurity  . Worried About Programme researcher, broadcasting/film/video in the Last Year: Never true  . Ran Out of Food in the Last Year: Never true  Transportation Needs: No  Transportation Needs  . Lack of Transportation (Medical): No  . Lack of Transportation (Non-Medical): No  Physical Activity: Inactive  . Days of Exercise per Week: 0 days  . Minutes of Exercise per Session: 0 min  Stress: No Stress Concern Present  . Feeling of Stress : Not at all  Social Connections: Moderately Integrated  . Frequency of Communication with Friends and Family: More than three times a week  . Frequency of Social Gatherings with Friends and Family: More than three times a week  . Attends Religious Services: More than 4 times per year  . Active Member of Clubs or Organizations: No  . Attends Banker Meetings: Never  . Marital Status: Married    Tobacco Counseling Counseling given: Not Answered   Clinical Intake:  Pre-visit preparation completed: Yes  Pain : No/denies pain     Nutritional Risks: None Diabetes: No  How often do you need to have someone help you when you read instructions, pamphlets, or other written materials from your doctor or pharmacy?: 1 - Never What is the last grade level you completed in school?: College  Diabetic?No   Interpreter Needed?: No  Information entered by :: SCrews,LPN   Activities of Daily Living In your present state of health, do you have any difficulty performing the following activities: 03/17/2020  Hearing? Y  Comment Has bilateral hearing aids  Vision? N  Difficulty concentrating or making decisions? N  Walking or climbing stairs? N  Dressing or bathing? N  Doing errands, shopping? N  Preparing Food and eating ? N  Using the Toilet? N  In the past six months, have you accidently leaked urine? N  Do you have problems with loss of bowel control? N  Managing your Medications? N  Managing your Finances? N  Housekeeping or managing your Housekeeping? N  Some recent data might be hidden    Patient Care Team: Trevino, Danielle G, MD as PCP - General (Family Medicine)  Indicate any recent Medical  Services you may have received from other than [facility] providers in the past year (date may be approximate).     Assessment:   This is a routine wellness examination for [name].  Hearing/Vision screen  Hearing Screening   125Hz  250Hz  500Hz  1000Hz  2000Hz  3000Hz  4000Hz  6000Hz  8000Hz   Right ear:           Left ear:           Vision Screening Comments: Patient states gets eyes examined once per year    Dietary issues and exercise activities discussed: Current Exercise Habits: The patient does not participate in regular exercise at present, Exercise limited by: None identified  Goals    . Exercise 150 min/wk Moderate Activity    . stop having visual aura's      Depression Screen PHQ 2/9 Scores 03/17/2020 03/17/2019 03/15/2018 02/28/2017  PHQ - 2 Score 0 0 0 0    Fall Risk  Fall Risk  03/17/2020 03/17/2019 03/15/2018 07/30/2017 02/28/2017  Falls in the past year? 0 0 0 No No  Number falls in past yr: 0 - 0 - -  Injury with Fall? 0 - 0 - -  Risk for fall due to : No Fall Risks Medication side effect - - -  Follow up Falls evaluation completed;Falls prevention discussed Falls evaluation completed;Education provided;Falls prevention discussed Education provided - -    FALL RISK PREVENTION PERTAINING TO THE HOME:  Any stairs in or around the home? No  If so, are there any without handrails? No  Home free of loose throw rugs in walkways, pet beds, electrical cords, etc? Yes  Adequate lighting in your home to reduce risk of falls? Yes   ASSISTIVE DEVICES UTILIZED TO PREVENT FALLS:  Life alert? No  Use of a cane, walker or w/c? No  Grab bars in the bathroom? Yes  Shower chair or bench in shower? Yes  Elevated toilet seat or a handicapped toilet? Yes     Cognitive Function:     6CIT Screen 03/17/2019  What Year? 0 points  What month? 0 points  What time? 0 points  Count back from 20 0 points  Months in reverse 0 points  Repeat phrase 0 points  Total Score 0     Immunizations  There is no immunization history on file for this patient.  TDAP status: Due, Education has been provided regarding the importance of this vaccine. Advised may receive this vaccine at local pharmacy or Health Dept. Aware to provide a copy of the vaccination record if obtained from local pharmacy or Health Dept. Verbalized acceptance and understanding.  Flu Vaccine status: Due, Education has been provided regarding the importance of this vaccine. Advised may receive this vaccine at local pharmacy or Health Dept. Aware to provide a copy of the vaccination record if obtained from local pharmacy or Health Dept. Verbalized acceptance and understanding.  Pneumococcal vaccine status: Due, Education has been provided regarding the importance of this vaccine. Advised may receive this vaccine at local pharmacy or Health Dept. Aware to provide a copy of the vaccination record if obtained from local pharmacy or Health Dept. Verbalized acceptance and understanding.  Covid-19 vaccine status: Declined, Education has been provided regarding the importance of this vaccine but patient still declined. Advised may receive this vaccine at local pharmacy or Health Dept.or vaccine clinic. Aware to provide a copy of the vaccination record if obtained from local pharmacy or Health Dept. Verbalized acceptance and understanding.  Qualifies for Shingles Vaccine? Yes   Zostavax completed No   Shingrix Completed?: No.    Education has been provided regarding the importance of this vaccine. Patient has been advised to call insurance company to determine out of pocket expense if they have not yet received this vaccine. Advised may also receive vaccine at local pharmacy or Health Dept. Verbalized acceptance and understanding.  Screening Tests Health Maintenance  Topic Date Due  . INFLUENZA VACCINE  Discontinued  . DEXA SCAN  Discontinued  . COLONOSCOPY (Pts 45-33yrs Insurance coverage will need to be  confirmed)  Discontinued  . TETANUS/TDAP  Discontinued  . COVID-19 Vaccine  Discontinued  . Hepatitis C Screening  Discontinued  . PNA vac Low Risk Adult  Discontinued    Health Maintenance  There are no preventive care reminders to display for this patient.  Colorectal cancer screening: No longer required.   Mammogram status: Patient declined   Bone Density Status: Patient declined  Lung Cancer Screening: (Low Dose CT Chest recommended if Age 70-80 years, 30 pack-year currently smoking OR have quit w/in 15years.) does not qualify.   Lung Cancer Screening Referral: N/A   Additional Screening:  Hepatitis C Screening: does qualify;   Vision Screening: Recommended annual ophthalmology exams for early detection of glaucoma and other disorders of the eye. Is the patient up to date with their annual eye exam?  Yes  Who is the provider or what is the name of the office in which the patient attends annual eye exams? Dr. Martha Clan  If pt is not established with a provider, would they like to be referred to a provider to establish care? No .   Dental Screening: Recommended annual dental exams for proper oral hygiene  Community Resource Referral / Chronic Care Management: CRR required this visit?  No   CCM required this visit?  No      Plan:     I have personally reviewed and noted the following in the patient's chart:   . Medical and social history . Use of alcohol, tobacco or illicit drugs  . Current medications and supplements . Functional ability and status . Nutritional status . Physical activity . Advanced directives . List of other physicians . Hospitalizations, surgeries, and ER visits in previous 12 months . Vitals . Screenings to include cognitive, depression, and falls . Referrals and appointments  In addition, I have reviewed and discussed with patient certain preventive protocols, quality metrics, and best practice recommendations. A written personalized care  plan for preventive services as well as general preventive health recommendations were provided to patient.     Theodora Blow, LPN   8/88/2800   Nurse Notes: none

## 2020-03-17 NOTE — Patient Instructions (Signed)
Danielle Trevino , Thank you for taking time to come for your Medicare Wellness Visit. I appreciate your ongoing commitment to your health goals. Please review the following plan we discussed and let me know if I can assist you in the future.   Screening recommendations/referrals: Colonoscopy: Patient declined  Mammogram: Patient declined  Bone Density: Patient declined  Recommended yearly ophthalmology/optometry visit for glaucoma screening and checkup Recommended yearly dental visit for hygiene and checkup  Vaccinations: Influenza vaccine: Patient declined  Pneumococcal vaccine: Patient declined  Tdap vaccine: patient declined  Shingles vaccine: Patient declined     Advanced directives: Please bring copies of your advanced medical directives into our office so that we may scan them into your chart.   Conditions/risks identified: None   Next appointment: 04/17/2020 @ 10:00 am with Dr. [place]   Preventive Care 76 Years and Older, Female Preventive care refers to lifestyle choices and visits with your health care provider that can promote health and wellness. What does preventive care include?  A yearly physical exam. This is also called an annual well check.  Dental exams once or twice a year.  Routine eye exams. Ask your health care provider how often you should have your eyes checked.  Personal lifestyle choices, including:  Daily care of your teeth and gums.  Regular physical activity.  Eating a healthy diet.  Avoiding tobacco and drug use.  Limiting alcohol use.  Practicing safe sex.  Taking low-dose aspirin every day.  Taking vitamin and mineral supplements as recommended by your health care provider. What happens during an annual well check? The services and screenings done by your health care provider during your annual well check will depend on your age, overall health, lifestyle risk factors, and family history of disease. Counseling  Your health care provider  may ask you questions about your:  Alcohol use.  Tobacco use.  Drug use.  Emotional well-being.  Home and relationship well-being.  Sexual activity.  Eating habits.  History of falls.  Memory and ability to understand (cognition).  Work and work Astronomer.  Reproductive health. Screening  You may have the following tests or measurements:  Height, weight, and BMI.  Blood pressure.  Lipid and cholesterol levels. These may be checked every 5 years, or more frequently if you are over 40 years old.  Skin check.  Lung cancer screening. You may have this screening every year starting at age 76 if you have a 30-pack-year history of smoking and currently smoke or have quit within the past 15 years.  Fecal occult blood test (FOBT) of the stool. You may have this test every year starting at age 76.  Flexible sigmoidoscopy or colonoscopy. You may have a sigmoidoscopy every 5 years or a colonoscopy every 10 years starting at age 76.  Hepatitis C blood test.  Hepatitis B blood test.  Sexually transmitted disease (STD) testing.  Diabetes screening. This is done by checking your blood sugar (glucose) after you have not eaten for a while (fasting). You may have this done every 1-3 years.  Bone density scan. This is done to screen for osteoporosis. You may have this done starting at age 65.  Mammogram. This may be done every 1-2 years. Talk to your health care provider about how often you should have regular mammograms.  Talk with your health care provider about your test results, treatment options, and if necessary, the need for more tests. Vaccines  Your health care provider may recommend certain vaccines, such as:  Influenza  vaccine. This is recommended every year.  Tetanus, diphtheria, and acellular pertussis (Tdap, Td) vaccine. You may need a Td booster every 10 years.  Zoster vaccine. You may need this after age 25.  Pneumococcal 13-valent conjugate (PCV13) vaccine.  One dose is recommended after age 76.   Pneumococcal polysaccharide (PPSV23) vaccine. One dose is recommended after age 76. Talk to your health care provider about which screenings and vaccines you need and how often you need them. This information is not intended to replace advice given to you by your health care provider. Make sure you discuss any questions you have with your health care provider. Document Released: 03/03/2015 Document Revised: 10/25/2015 Document Reviewed: 12/06/2014 Elsevier Interactive Patient Education  2017 [place] Prevention in the Home Falls can cause injuries. They can happen to people of all ages. There are many things you can do to make your home safe and to help prevent falls. What can I do on the outside of my home?  Regularly fix the edges of walkways and driveways and fix any cracks.  Remove anything that might make you trip as you walk through a door, such as a raised step or threshold.  Trim any bushes or trees on the path to your home.  Use bright outdoor lighting.  Clear any walking paths of anything that might make someone trip, such as rocks or tools.  Regularly check to see if handrails are loose or broken. Make sure that both sides of any steps have handrails.  Any raised decks and porches should have guardrails on the edges.  Have any leaves, snow, or ice cleared regularly.  Use sand or salt on walking paths during winter.  Clean up any spills in your garage right away. This includes oil or grease spills. What can I do in the bathroom?  Use night lights.  Install grab bars by the toilet and in the tub and shower. Do not use towel bars as grab bars.  Use non-skid mats or decals in the tub or shower.  If you need to sit down in the shower, use a plastic, non-slip stool.  Keep the floor dry. Clean up any water that spills on the floor as soon as it happens.  Remove soap buildup in the tub or shower regularly.  Attach bath  mats securely with double-sided non-slip rug tape.  Do not have throw rugs and other things on the floor that can make you trip. What can I do in the bedroom?  Use night lights.  Make sure that you have a light by your bed that is easy to reach.  Do not use any sheets or blankets that are too big for your bed. They should not hang down onto the floor.  Have a firm chair that has side arms. You can use this for support while you get dressed.  Do not have throw rugs and other things on the floor that can make you trip. What can I do in the kitchen?  Clean up any spills right away.  Avoid walking on wet floors.  Keep items that you use a lot in easy-to-reach places.  If you need to reach something above you, use a strong step stool that has a grab bar.  Keep electrical cords out of the way.  Do not use floor polish or wax that makes floors slippery. If you must use wax, use non-skid floor wax.  Do not have throw rugs and other things on the floor that can  make you trip. What can I do with my stairs?  Do not leave any items on the stairs.  Make sure that there are handrails on both sides of the stairs and use them. Fix handrails that are broken or loose. Make sure that handrails are as long as the stairways.  Check any carpeting to make sure that it is firmly attached to the stairs. Fix any carpet that is loose or worn.  Avoid having throw rugs at the top or bottom of the stairs. If you do have throw rugs, attach them to the floor with carpet tape.  Make sure that you have a light switch at the top of the stairs and the bottom of the stairs. If you do not have them, ask someone to add them for you. What else can I do to help prevent falls?  Wear shoes that:  Do not have high heels.  Have rubber bottoms.  Are comfortable and fit you well.  Are closed at the toe. Do not wear sandals.  If you use a stepladder:  Make sure that it is fully opened. Do not climb a closed  stepladder.  Make sure that both sides of the stepladder are locked into place.  Ask someone to hold it for you, if possible.  Clearly mark and make sure that you can see:  Any grab bars or handrails.  First and last steps.  Where the edge of each step is.  Use tools that help you move around (mobility aids) if they are needed. These include:  Canes.  Walkers.  Scooters.  Crutches.  Turn on the lights when you go into a dark area. Replace any light bulbs as soon as they burn out.  Set up your furniture so you have a clear path. Avoid moving your furniture around.  If any of your floors are uneven, fix them.  If there are any pets around you, be aware of where they are.  Review your medicines with your doctor. Some medicines can make you feel dizzy. This can increase your chance of falling. Ask your doctor what other things that you can do to help prevent falls. This information is not intended to replace advice given to you by your health care provider. Make sure you discuss any questions you have with your health care provider. Document Released: 12/01/2008 Document Revised: 07/13/2015 Document Reviewed: 03/11/2014 Elsevier Interactive Patient Education  2017 [employer].

## 2020-04-17 ENCOUNTER — Ambulatory Visit (INDEPENDENT_AMBULATORY_CARE_PROVIDER_SITE_OTHER): Payer: Medicare PPO | Admitting: Family Medicine

## 2020-04-17 ENCOUNTER — Encounter: Payer: Self-pay | Admitting: Family Medicine

## 2020-04-17 ENCOUNTER — Other Ambulatory Visit: Payer: Self-pay

## 2020-04-17 VITALS — BP 122/80 | HR 78 | Resp 16 | Ht 62.0 in | Wt 167.0 lb

## 2020-04-17 DIAGNOSIS — I10 Essential (primary) hypertension: Secondary | ICD-10-CM

## 2020-04-17 DIAGNOSIS — E785 Hyperlipidemia, unspecified: Secondary | ICD-10-CM | POA: Diagnosis not present

## 2020-04-17 DIAGNOSIS — Z0000 Encounter for general adult medical examination without abnormal findings: Secondary | ICD-10-CM

## 2020-04-17 DIAGNOSIS — L570 Actinic keratosis: Secondary | ICD-10-CM | POA: Diagnosis not present

## 2020-04-17 DIAGNOSIS — F419 Anxiety disorder, unspecified: Secondary | ICD-10-CM

## 2020-04-17 DIAGNOSIS — Z5181 Encounter for therapeutic drug level monitoring: Secondary | ICD-10-CM

## 2020-04-17 LAB — VITAMIN D 25 HYDROXY (VIT D DEFICIENCY, FRACTURES): VITD: 52.51 ng/mL (ref 30.00–100.00)

## 2020-04-17 LAB — COMPREHENSIVE METABOLIC PANEL
ALT: 15 U/L (ref 0–35)
AST: 18 U/L (ref 0–37)
Albumin: 3.9 g/dL (ref 3.5–5.2)
Alkaline Phosphatase: 70 U/L (ref 39–117)
BUN: 16 mg/dL (ref 6–23)
CO2: 30 mEq/L (ref 19–32)
Calcium: 9.3 mg/dL (ref 8.4–10.5)
Chloride: 106 mEq/L (ref 96–112)
Creatinine, Ser: 0.75 mg/dL (ref 0.40–1.20)
GFR: 77.8 mL/min (ref >60.00)
Glucose, Bld: 91 mg/dL (ref 70–99)
Potassium: 4.5 mEq/L (ref 3.5–5.1)
Sodium: 144 mEq/L (ref 135–145)
Total Bilirubin: 0.5 mg/dL (ref 0.2–1.2)
Total Protein: 6.3 g/dL (ref 6.0–8.3)

## 2020-04-17 LAB — LIPID PANEL
Cholesterol: 181 mg/dL (ref 0–200)
HDL: 49.7 mg/dL (ref >39.00)
LDL Cholesterol: 95 mg/dL (ref 0–99)
NonHDL: 131.54
Total CHOL/HDL Ratio: 4
Triglycerides: 182 mg/dL — ABNORMAL HIGH (ref 0.0–149.0)
VLDL: 36.4 mg/dL (ref 0.0–40.0)

## 2020-04-17 MED ORDER — LISINOPRIL 10 MG PO TABS
10.0000 mg | ORAL_TABLET | Freq: Two times a day (BID) | ORAL | 3 refills | Status: DC
Start: 2020-04-17 — End: 2021-04-18

## 2020-04-17 NOTE — Progress Notes (Unsigned)
HPI: Danielle Trevino is a pleasant 75 y.o. female, who is here today for her routine physical.  Last CPE: 04/16/19. AWV on 03/17/20.  Regular exercise 3 or more time per week: Active around the house and parking far from stores. Following a healthy diet: She cooks at home, she eats fish 2 times per week and not a lot pork or red meat.  She lives with her husband.  Chronic medical problems: CVA,HTN,HLD,anxiety,and SK among some.   There is no immunization history on file for this patient.  Mammogram: 03/02/08. Colonoscopy: Never. DEXA: Never.  She is on Vit D 4000 U daily for at least 6 months, not sure about hx of deficiency. She would like to have vit D check.  Anxiety has improved. She has not taking Valium in months.  HTN: She is on Lisinopril 10 mg bid and HCTZ 12.5 mg daily. BP readings most in the 120's/70's, occasionally SBP 140's and low 110's.  HLD: She is on Aspirin 81 mg daily. She is not on statin.  Component     Latest Ref Rng & Units 04/16/2019  Cholesterol     0 - 200 mg/dL 192  Triglycerides     0.0 - 149.0 mg/dL 160.0 (H)  HDL Cholesterol     >39.00 mg/dL 41.80  VLDL     0.0 - 40.0 mg/dL 32.0  LDL (calc)     0 - 99 mg/dL 118 (H)  Total CHOL/HDL Ratio      5  NonHDL      150.43   Neck skin lesion, slow growth.  No pruritus or easy bleeding.  Review of Systems  Constitutional: Negative for appetite change, fatigue and fever.  HENT: Negative for hearing loss, mouth sores, sore throat, trouble swallowing and voice change.   Eyes: Negative for redness and visual disturbance.  Respiratory: Negative for cough, shortness of breath and wheezing.   Cardiovascular: Negative for chest pain and leg swelling.  Gastrointestinal: Negative for abdominal pain, nausea and vomiting.       No changes in bowel habits.  Endocrine: Negative for cold intolerance, heat intolerance, polydipsia, polyphagia and polyuria.  Genitourinary: Negative for decreased  urine volume, dysuria, hematuria, vaginal bleeding and vaginal discharge.  Musculoskeletal: Negative for gait problem and myalgias.  Skin: Negative for color change and rash.  Allergic/Immunologic: Negative for environmental allergies.  Neurological: Negative for syncope, weakness and headaches.  Hematological: Negative for adenopathy. Does not bruise/bleed easily.  Psychiatric/Behavioral: Negative for confusion and sleep disturbance.  All other systems reviewed and are negative.  Current Outpatient Medications on File Prior to Visit  Medication Sig Dispense Refill  . aspirin EC 81 MG tablet Take 81 mg by mouth daily.    . diazepam (VALIUM) 5 MG tablet Take 0.5-1 tablets (2.5-5 mg total) by mouth every 12 (twelve) hours as needed for anxiety. 20 tablet 2  . hydrochlorothiazide (HYDRODIURIL) 12.5 MG tablet Take 1 tablet (12.5 mg total) by mouth daily. 90 tablet 3  . triamcinolone cream (KENALOG) 0.1 % Apply 1 application topically 2 (two) times daily as needed. 30 g 2  . TURMERIC PO Take 1 tablet by mouth daily.     No current facility-administered medications on file prior to visit.   Past Medical History:  Diagnosis Date  . Anxiety   . Hyperlipidemia   . Hypertension   . Stroke [facility])     Past Surgical History:  Procedure Laterality Date  . APPENDECTOMY    . TONSILLECTOMY  Allergies  Allergen Reactions  . Erythromycin     IV    Family History  Problem Relation Age of Onset  . Arthritis Mother   . Heart disease Mother   . Arthritis Father   . Heart disease Father   . Diabetes Maternal Grandfather     Social History   Socioeconomic History  . Marital status: Married    Spouse name: Quita Skye  . Number of children: 0  . Years of education: 16  . Highest education level: Bachelor's degree (e.g., BA, AB, BS)  Occupational History  . Occupation: retired  Tobacco Use  . Smoking status: Former Smoker    Quit date: 05/19/2017    Years since quitting: 2.9  . Smokeless  tobacco: Never Used  Substance and Sexual Activity  . Alcohol use: No  . Drug use: No  . Sexual activity: Not on file  Other Topics Concern  . Not on file  Social History Narrative   Patient is right-handed. She lives with her husband in a one story house. She avoids caffeine. She does not exercise. She is a retired Tourist information centre manager.   Social Determinants of Health   Financial Resource Strain: Low Risk   . Difficulty of Paying Living Expenses: Not hard at all  Food Insecurity: No Food Insecurity  . Worried About Charity fundraiser in the Last Year: Never true  . Ran Out of Food in the Last Year: Never true  Transportation Needs: No Transportation Needs  . Lack of Transportation (Medical): No  . Lack of Transportation (Non-Medical): No  Physical Activity: Inactive  . Days of Exercise per Week: 0 days  . Minutes of Exercise per Session: 0 min  Stress: No Stress Concern Present  . Feeling of Stress : Not at all  Social Connections: Moderately Integrated  . Frequency of Communication with Friends and Family: More than three times a week  . Frequency of Social Gatherings with Friends and Family: More than three times a week  . Attends Religious Services: More than 4 times per year  . Active Member of Clubs or Organizations: No  . Attends Archivist Meetings: Never  . Marital Status: Married   Vitals:   04/17/20 0954  BP: 122/80  Pulse: 78  Resp: 16  SpO2: 94%   Body mass index is 30.54 kg/m.  Wt Readings from Last 3 Encounters:  04/17/20 167 lb (75.8 kg)  10/15/19 170 lb 6 oz (77.3 kg)  06/09/19 171 lb 2 oz (77.6 kg)   Physical Exam Vitals and nursing note reviewed.  Constitutional:      General: She is not in acute distress.    Appearance: She is well-developed.  HENT:     Head: Normocephalic and atraumatic.     Right Ear: Hearing, tympanic membrane, ear canal and external ear normal.     Left Ear: Hearing, tympanic membrane, ear canal and external ear normal.      Mouth/Throat:     Mouth: Oropharynx is clear and moist and mucous membranes are normal.     Pharynx: Uvula midline.  Eyes:     Extraocular Movements: Extraocular movements intact and EOM normal.     Conjunctiva/sclera: Conjunctivae normal.     Pupils: Pupils are equal, round, and reactive to light.  Neck:     Thyroid: No thyromegaly.     Trachea: No tracheal deviation.  Cardiovascular:     Rate and Rhythm: Normal rate and regular rhythm.     Pulses:  Dorsalis pedis pulses are 2+ on the right side and 2+ on the left side.     Heart sounds: No murmur heard.   Pulmonary:     Effort: Pulmonary effort is normal. No respiratory distress.     Breath sounds: Normal breath sounds.  Chest:  Breasts:     Right: No supraclavicular adenopathy.     Left: No supraclavicular adenopathy.    Abdominal:     Palpations: Abdomen is soft. There is no hepatomegaly or mass.     Tenderness: There is no abdominal tenderness.  Musculoskeletal:        General: No edema.     Comments: No signs of synovitis appreciated.  Lymphadenopathy:     Cervical: No cervical adenopathy.     Upper Body:     Right upper body: No supraclavicular adenopathy.     Left upper body: No supraclavicular adenopathy.  Skin:    General: Skin is warm.     Findings: No erythema or rash.       Neurological:     Mental Status: She is alert and oriented to person, place, and time.     Cranial Nerves: No cranial nerve deficit.     Gait: Gait normal.     Deep Tendon Reflexes: Strength normal.     Reflex Scores:      Bicep reflexes are 2+ on the right side and 2+ on the left side.      Patellar reflexes are 2+ on the right side and 2+ on the left side. Psychiatric:        Mood and Affect: Mood and affect normal. Mood is not anxious or depressed.     Comments: Well groomed, good eye contact.   ASSESSMENT AND PLAN:  [name] was here today annual physical examination.  Orders Placed This Encounter   Procedures  . VITAMIN D 25 Hydroxy (Vit-D Deficiency, Fractures)  . Comprehensive metabolic panel  . Lipid panel   Lab Results  Component Value Date   CHOL 181 04/17/2020   HDL 49.70 04/17/2020   LDLCALC 95 04/17/2020   TRIG 182.0 (H) 04/17/2020   CHOLHDL 4 04/17/2020   Lab Results  Component Value Date   CREATININE 0.75 04/17/2020   BUN 16 04/17/2020   NA 144 04/17/2020   K 4.5 04/17/2020   CL 106 04/17/2020   CO2 30 04/17/2020   Lab Results  Component Value Date   ALT 15 04/17/2020   AST 18 04/17/2020   ALKPHOS 70 04/17/2020   BILITOT 0.5 04/17/2020   Routine general medical examination at a health care facility She understands the importance of regular physical activity and healthy diet for prevention of chronic illness and/or complications. Preventive guidelines reviewed. Vaccination:Not interested in vaccination. She is not interested in further screening, including low density CT for lung cancer screening.  Ca++ and vit D supplementation recommended. Next CPE in a year.  Hypertension, essential, benign BP in normal range. Continue Lisinopril 10 mg bid and HCTZ 12.5 mg daily. Low salt diet. Continue monitoring BP regularly. She refers annual follow ups, before if needed.  -     lisinopril (ZESTRIL) 10 MG tablet; Take 1 tablet (10 mg total) by mouth 2 (two) times daily.  Hyperlipidemia, unspecified hyperlipidemia type She is not interested in statin meds. Continue non pharmacologic treatment.  Anxiety disorder, unspecified type Improved. Valium left on medication list in case she needs it.  Encounter for medication monitoring -     VITAMIN  D 25 Hydroxy (Vit-D Deficiency, Fractures)  AK (actinic keratosis) She would like lesion removed, she agrees with cryotherapy. After verbal consent lesion was treated with liquid nitrogen  Return in about 1 year (around 04/17/2021) for cpe and follow up.  Betty G. [place], MD  [facility]. [place]  office.   A few things to remember from today's visit:   Routine general medical examination at a health care facility  Hypertension, essential, benign - Plan: Comprehensive metabolic panel  Hyperlipidemia, unspecified hyperlipidemia type - Plan: Comprehensive metabolic panel, Lipid panel  Anxiety disorder, unspecified type  Encounter for medication monitoring - Plan: VITAMIN D 25 Hydroxy (Vit-D Deficiency, Fractures)  If you need refills please call your pharmacy. Do not use My Chart to request refills or for acute issues that need immediate attention.    Please be sure medication list is accurate. If a new problem present, please set up appointment sooner than planned today.  A few tips:  -As we age balance is not as good as it was, so there is a higher risks for falls. Please remove small rugs and furniture that is "in your way" and could increase the risk of falls. Stretching exercises may help with fall prevention: Yoga and Tai Chi are some examples. Low impact exercise is better, so you are not very achy the next day.  -Sun screen and avoidance of direct sun light recommended. Caution with dehydration, if working outdoors be sure to drink enough fluids.  - Some medications are not safe as we age, increases the risk of side effects and can potentially interact with other medication you are also taken;  including some of over the counter medications. Be sure to let me know when you start a new medication even if it is a dietary/vitamin supplement.   -Healthy diet low in red meet/animal fat and sugar + regular physical activity is recommended.

## 2020-04-17 NOTE — Patient Instructions (Addendum)
A few things to remember from today's visit:   Routine general medical examination at a health care facility  Hypertension, essential, benign - Plan: Comprehensive metabolic panel  Hyperlipidemia, unspecified hyperlipidemia type - Plan: Comprehensive metabolic panel, Lipid panel  Anxiety disorder, unspecified type  Encounter for medication monitoring - Plan: VITAMIN D 25 Hydroxy (Vit-D Deficiency, Fractures)  If you need refills please call your pharmacy. Do not use My Chart to request refills or for acute issues that need immediate attention.    Please be sure medication list is accurate. If a new problem present, please set up appointment sooner than planned today.  A few tips:  -As we age balance is not as good as it was, so there is a higher risks for falls. Please remove small rugs and furniture that is "in your way" and could increase the risk of falls. Stretching exercises may help with fall prevention: Yoga and Tai Chi are some examples. Low impact exercise is better, so you are not very achy the next day.  -Sun screen and avoidance of direct sun light recommended. Caution with dehydration, if working outdoors be sure to drink enough fluids.  - Some medications are not safe as we age, increases the risk of side effects and can potentially interact with other medication you are also taken;  including some of over the counter medications. Be sure to let me know when you start a new medication even if it is a dietary/vitamin supplement.   -Healthy diet low in red meet/animal fat and sugar + regular physical activity is recommended.

## 2020-05-08 ENCOUNTER — Encounter: Payer: Self-pay | Admitting: Family Medicine

## 2020-05-22 ENCOUNTER — Other Ambulatory Visit: Payer: Self-pay

## 2020-05-22 ENCOUNTER — Ambulatory Visit: Payer: Medicare PPO | Admitting: Family Medicine

## 2020-05-22 ENCOUNTER — Encounter: Payer: Self-pay | Admitting: Family Medicine

## 2020-05-22 VITALS — BP 128/80 | HR 95 | Resp 16 | Ht 62.0 in

## 2020-05-22 DIAGNOSIS — L570 Actinic keratosis: Secondary | ICD-10-CM | POA: Diagnosis not present

## 2020-05-22 DIAGNOSIS — I10 Essential (primary) hypertension: Secondary | ICD-10-CM

## 2020-05-22 NOTE — Progress Notes (Signed)
Chief Complaint  Patient presents with  . Skin Tag   HPI: [name] is a pleasant  75 y.o. female, who is here today with her husband to have skin tag treated. Last visit lesion was treated with liquid nitrogen, she tolerated procedure well, slightly decreased in size. Lesion is frequently irritated due to localization. No easy bleeding,tenderness,or pruritic.  HTN:  She had a couple days of elevated BP. Not sure about was exacerbated problem. She had an episode of lightheadedness while she was in the kitchen.Checked BP, initially 180/82.  She took Valium 5 mg 1/2 tab and went to bed.  For the next 2-3 days BP were 140-170/70-80's. BP is now 110's-120's/70's. She is on Lisinopril 10 mg bid and HCTZ 12.5 mg daily.  Negative for headache, visual changes, chest pain, dyspnea, palpitation, claudication, focal weakness, or edema.  Lab Results  Component Value Date   CREATININE 0.75 04/17/2020   BUN 16 04/17/2020   NA 144 04/17/2020   K 4.5 04/17/2020   CL 106 04/17/2020   CO2 30 04/17/2020   Review of Systems  Constitutional: Negative for activity change, appetite change, fatigue and fever.  HENT: Negative for mouth sores and nosebleeds.   Eyes: Negative for redness and visual disturbance.  Respiratory: Negative for cough and wheezing.   Gastrointestinal: Negative for abdominal pain, nausea and vomiting.       Negative for changes in bowel habits.  Genitourinary: Negative for decreased urine volume, dysuria and hematuria.  Neurological: Negative for syncope and facial asymmetry.  Rest see pertinent positives and negatives per HPI.  Current Outpatient Medications on File Prior to Visit  Medication Sig Dispense Refill  . aspirin EC 81 MG tablet Take 81 mg by mouth daily.    . diazepam (VALIUM) 5 MG tablet Take 0.5-1 tablets (2.5-5 mg total) by mouth every 12 (twelve) hours as needed for anxiety. 20 tablet 2  . hydrochlorothiazide (HYDRODIURIL) 12.5 MG tablet Take  1 tablet (12.5 mg total) by mouth daily. 90 tablet 3  . lisinopril (ZESTRIL) 10 MG tablet Take 1 tablet (10 mg total) by mouth 2 (two) times daily. 180 tablet 3  . triamcinolone cream (KENALOG) 0.1 % Apply 1 application topically 2 (two) times daily as needed. 30 g 2  . TURMERIC PO Take 1 tablet by mouth daily.     No current facility-administered medications on file prior to visit.   Past Medical History:  Diagnosis Date  . Anxiety   . Hyperlipidemia   . Hypertension   . Stroke [facility])    Allergies  Allergen Reactions  . Erythromycin     IV    Social History   Socioeconomic History  . Marital status: Married    Spouse name: Danielle Trevino  . Number of children: 0  . Years of education: 16  . Highest education level: Bachelor's degree (e.g., BA, AB, BS)  Occupational History  . Occupation: retired  Tobacco Use  . Smoking status: Former Smoker    Quit date: 05/19/2017    Years since quitting: 3.0  . Smokeless tobacco: Never Used  Substance and Sexual Activity  . Alcohol use: No  . Drug use: No  . Sexual activity: Not on file  Other Topics Concern  . Not on file  Social History Narrative   Patient is right-handed. She lives with her husband in a one story house. She avoids caffeine. She does not exercise. She is a retired Programmer, systems.   Social Determinants of Health  Financial Resource Strain: Low Risk   . Difficulty of Paying Living Expenses: Not hard at all  Food Insecurity: No Food Insecurity  . Worried About Programme researcher, broadcasting/film/video in the Last Year: Never true  . Ran Out of Food in the Last Year: Never true  Transportation Needs: No Transportation Needs  . Lack of Transportation (Medical): No  . Lack of Transportation (Non-Medical): No  Physical Activity: Inactive  . Days of Exercise per Week: 0 days  . Minutes of Exercise per Session: 0 min  Stress: No Stress Concern Present  . Feeling of Stress : Not at all  Social Connections: Moderately Integrated  . Frequency of  Communication with Friends and Family: More than three times a week  . Frequency of Social Gatherings with Friends and Family: More than three times a week  . Attends Religious Services: More than 4 times per year  . Active Member of Clubs or Organizations: No  . Attends Banker Meetings: Never  . Marital Status: Married   Vitals:   05/22/20 1420  BP: 128/80  Pulse: 95  Resp: 16  SpO2: 96%   Body mass index is 30.54 kg/m.  Physical Exam Vitals and nursing note reviewed.  Constitutional:      General: She is not in acute distress.    Appearance: She is well-developed.  HENT:     Head: Normocephalic and atraumatic.  Eyes:     Conjunctiva/sclera: Conjunctivae normal.  Neck:   Cardiovascular:     Rate and Rhythm: Normal rate and regular rhythm.     Heart sounds: No murmur heard.   Pulmonary:     Effort: Pulmonary effort is normal. No respiratory distress.     Breath sounds: Normal breath sounds.  Skin:    General: Skin is warm.     Findings: No erythema or rash.  Neurological:     General: No focal deficit present.     Mental Status: She is alert and oriented to person, place, and time.     Gait: Gait normal.  Psychiatric:     Comments: Well groomed, good eye contact.    ASSESSMENT AND PLAN: [name] was seen today for skin tag.  Diagnoses and all orders for this visit:  AK (actinic keratosis) We discussed differential Dx's. Because of localization and size I do not feel comfortable shaving it at this time. After verbal consent and discussion of risk/benefits of procedure lesion was treated with liquid nitrogen x 4.Mild erythema after treatment. Post procedure instructions given. Procedure can be repeated in 4-8 weeks.  Hypertension, essential, benign BP is now adequate controlled. Continue Lisinopril 10 mg bid and HCTZ 12.5 mg daily. Low salt diet. Instructed about warning signs.  Return in about 8 weeks (around 07/17/2020), or if symptoms  worsen or fail to improve, for skin lesion/AK.   Danielle Trevino G. [place], MD  [facility]. [place] office.  A few things to remember from today's visit:   Hypertension, essential, benign  AK (actinic keratosis)  If you need refills please call your pharmacy. Do not use My Chart to request refills or for acute issues that need immediate attention.   No changes in medications. Continue monitoring blood pressure. Skin lesion may form a blister. We will continue treating regularly.  Please be sure medication list is accurate. If a new problem present, please set up appointment sooner than planned today.

## 2020-05-22 NOTE — Patient Instructions (Signed)
A few things to remember from today's visit:   Hypertension, essential, benign  AK (actinic keratosis)  If you need refills please call your pharmacy. Do not use My Chart to request refills or for acute issues that need immediate attention.   No changes in medications. Continue monitoring blood pressure. Skin lesion may form a blister. We will continue treating regularly.  Please be sure medication list is accurate. If a new problem present, please set up appointment sooner than planned today.

## 2020-06-29 DIAGNOSIS — H35033 Hypertensive retinopathy, bilateral: Secondary | ICD-10-CM | POA: Diagnosis not present

## 2020-06-29 DIAGNOSIS — H2513 Age-related nuclear cataract, bilateral: Secondary | ICD-10-CM | POA: Diagnosis not present

## 2020-08-01 ENCOUNTER — Encounter: Payer: Self-pay | Admitting: Family Medicine

## 2020-08-01 ENCOUNTER — Other Ambulatory Visit: Payer: Self-pay

## 2020-08-01 ENCOUNTER — Ambulatory Visit: Payer: Medicare PPO | Admitting: Family Medicine

## 2020-08-01 VITALS — BP 120/80 | HR 92 | Resp 16 | Ht 62.0 in | Wt 173.2 lb

## 2020-08-01 DIAGNOSIS — R600 Localized edema: Secondary | ICD-10-CM | POA: Diagnosis not present

## 2020-08-01 DIAGNOSIS — L719 Rosacea, unspecified: Secondary | ICD-10-CM | POA: Diagnosis not present

## 2020-08-01 DIAGNOSIS — I10 Essential (primary) hypertension: Secondary | ICD-10-CM | POA: Diagnosis not present

## 2020-08-01 MED ORDER — FUROSEMIDE 20 MG PO TABS: 10.0000 mg | ORAL_TABLET | Freq: Every day | ORAL | 3 refills | Status: DC | PRN

## 2020-08-01 MED ORDER — AMLODIPINE BESYLATE 2.5 MG PO TABS: 2.5000 mg | ORAL_TABLET | Freq: Every day | ORAL | 0 refills | Status: DC

## 2020-08-01 MED ORDER — METRONIDAZOLE 1 % EX GEL: Freq: Every day | CUTANEOUS | 0 refills | Status: DC

## 2020-08-01 NOTE — Progress Notes (Signed)
Chief Complaint  Patient presents with   medication concern   HPI: Danielle Trevino is a 75 y.o. female with hx of HTN,HLD,CVA,and anxiety here today with her husband complaining of elevated BP for the past few days, today BP reading at home was better. 140's-160's/70-80's. She tried Valium 5 mg 1/2 tab but did not help as it has done in the past when BP has been elevated due to anxiety. No new stressor or dietary changes.  Negative for headache, visual changes, chest pain, dyspnea, palpitation, or focal weakness. Peri-ankle edema, bilateral. No erythema or calves pain.  She is on Lisinopril 10 mg bid and HCTZ 12.5 mg daily.  Lab Results  Component Value Date   CREATININE 0.75 04/17/2020   BUN 16 04/17/2020   NA 144 04/17/2020   K 4.5 04/17/2020   CL 106 04/17/2020   CO2 30 04/17/2020   Recently had her eye exam,planning on having cataract surgery.  Facial skin rash intermittently, attributed to HCTZ. It is not pruritic. She has applied OTC cortisone cream. She has not identified exacerbating or alleviating factors.  Review of Systems  Constitutional:  Negative for activity change, appetite change, fatigue and fever.  HENT:  Negative for mouth sores, nosebleeds and sore throat.   Respiratory:  Negative for cough and wheezing.   Gastrointestinal:  Negative for abdominal pain, nausea and vomiting.       Negative for changes in bowel habits.  Genitourinary:  Negative for decreased urine volume, difficulty urinating, dysuria and hematuria.  Musculoskeletal:  Negative for gait problem and myalgias.  Neurological:  Negative for syncope, weakness and headaches.  Rest see pertinent positives and negatives per HPI.  Current Outpatient Medications on File Prior to Visit  Medication Sig Dispense Refill   aspirin EC 81 MG tablet Take 81 mg by mouth daily.     diazepam (VALIUM) 5 MG tablet Take 0.5-1 tablets (2.5-5 mg total) by mouth every 12 (twelve) hours as needed for  anxiety. 20 tablet 2   lisinopril (ZESTRIL) 10 MG tablet Take 1 tablet (10 mg total) by mouth 2 (two) times daily. 180 tablet 3   triamcinolone cream (KENALOG) 0.1 % Apply 1 application topically 2 (two) times daily as needed. 30 g 2   TURMERIC PO Take 1 tablet by mouth daily.     No current facility-administered medications on file prior to visit.   Past Medical History:  Diagnosis Date   Anxiety    Hyperlipidemia    Hypertension    Stroke (HCC)    Allergies  Allergen Reactions   Erythromycin     IV    Social History   Socioeconomic History   Marital status: Married    Spouse name: Amada Jupiter   Number of children: 0   Years of education: 16   Highest education level: Bachelor's degree (e.g., BA, AB, BS)  Occupational History   Occupation: retired  Tobacco Use   Smoking status: Former    Pack years: 0.00    Types: Cigarettes    Quit date: 05/19/2017    Years since quitting: 3.2   Smokeless tobacco: Never  Substance and Sexual Activity   Alcohol use: No   Drug use: No   Sexual activity: Not on file  Other Topics Concern   Not on file  Social History Narrative   Patient is right-handed. She lives with her husband in a one story house. She avoids caffeine. She does not exercise. She is a retired Programmer, systems.  Social Determinants of Health   Financial Resource Strain: Low Risk    Difficulty of Paying Living Expenses: Not hard at all  Food Insecurity: No Food Insecurity   Worried About Programme researcher, broadcasting/film/video in the Last Year: Never true   Ran Out of Food in the Last Year: Never true  Transportation Needs: No Transportation Needs   Lack of Transportation (Medical): No   Lack of Transportation (Non-Medical): No  Physical Activity: Inactive   Days of Exercise per Week: 0 days   Minutes of Exercise per Session: 0 min  Stress: No Stress Concern Present   Feeling of Stress : Not at all  Social Connections: Moderately Integrated   Frequency of Communication with Friends and  Family: More than three times a week   Frequency of Social Gatherings with Friends and Family: More than three times a week   Attends Religious Services: More than 4 times per year   Active Member of [employer] or Organizations: No   Attends Banker Meetings: Never   Marital Status: Married    Vitals:   08/01/20 1520  BP: 120/80  Pulse: 92  Resp: 16  SpO2: 95%   Body mass index is 31.69 kg/m.  Physical Exam Vitals and nursing note reviewed.  Constitutional:      General: She is not in acute distress.    Appearance: She is well-developed.  HENT:     Head: Normocephalic and atraumatic.     Mouth/Throat:     Mouth: Mucous membranes are moist.     Pharynx: Oropharynx is clear.  Eyes:     Conjunctiva/sclera: Conjunctivae normal.  Cardiovascular:     Rate and Rhythm: Normal rate and regular rhythm.     Pulses:          Dorsalis pedis pulses are 2+ on the right side and 2+ on the left side.     Heart sounds: No murmur heard.    Comments: Varicose veins LE, bilateral, mainly around ankles. Pulmonary:     Effort: Pulmonary effort is normal. No respiratory distress.     Breath sounds: Normal breath sounds.  Abdominal:     Palpations: Abdomen is soft. There is no hepatomegaly or mass.     Tenderness: There is no abdominal tenderness.  Lymphadenopathy:     Cervical: No cervical adenopathy.  Skin:    General: Skin is warm.     Findings: No erythema or rash.     Comments: No rash appreciated because make-up but picture with erythema and papular erythematous lesions on forehead,cheeks and on nose.   Neurological:     General: No focal deficit present.     Mental Status: She is alert and oriented to person, place, and time.     Cranial Nerves: No cranial nerve deficit.     Gait: Gait normal.  Psychiatric:     Comments: Well groomed, good eye contact.    ASSESSMENT AND PLAN:  Danielle Trevino was seen today for medication concern.  Diagnoses and all orders for this  visit:  Bilateral lower extremity edema We discussed possible ethologies. Hx and examination do not suggest a serious process. Caused by vein disease most likely. Furosemide 20 mg 1/2-1 tab daily prn, some side effects discussed. Compression stockings and elevation of LE above heart level a few times during the day will also help.  -     furosemide (LASIX) 20 MG tablet; Take 0.5-1 tablets (10-20 mg total) by mouth daily as needed.  Rosacea We  discussed differential Dx's. HCTZ seems to be aggravating problem, so d/c. Topical Metronidazole recommended. We can consider derma evaluation if problem does not resolve.  -     metroNIDAZOLE (METROGEL) 1 % gel; Apply topically daily.  Hypertension, essential, benign BP better controlled today. HCTZ discontinued. Continue Lisinopril 10 mg bid. She agrees with adding Amlodipine 2.5 mg at bedtime. Continue monitoring BP regularly and low salt diet. -     amLODipine (NORVASC) 2.5 MG tablet; Take 1 tablet (2.5 mg total) by mouth at bedtime.   Return in about 6 months (around 01/31/2021).   Aristeo Hankerson G. [place], MD  [facility]. [place] office.   A few things to remember from today's visit:   Hypertension, essential, benign - Plan: amLODipine (NORVASC) 2.5 MG tablet  Bilateral lower extremity edema - Plan: furosemide (LASIX) 20 MG tablet  Rosacea - Plan: metroNIDAZOLE (METROGEL) 1 % gel  If you need refills please call your pharmacy. Do not use My Chart to request refills or for acute issues that need immediate attention.   Amlodipine 2.5 mg at bedtime. For now no changes in Lisinopril but we could decrease dose if blood pressure is low. Stop Hydrochlorothiazide.  Let me know about blood pressure readings in 3-4 weeks.  Start Furosemide 20 mg 1/2-1 tab daily in the morning as needed. Lower extremity elevation above heart level and compression stockings.  Please be sure medication list is accurate. If a new problem  present, please set up appointment sooner than planned today.

## 2020-08-01 NOTE — Patient Instructions (Signed)
A few things to remember from today's visit:   Hypertension, essential, benign - Plan: amLODipine (NORVASC) 2.5 MG tablet  Bilateral lower extremity edema - Plan: furosemide (LASIX) 20 MG tablet  Rosacea - Plan: metroNIDAZOLE (METROGEL) 1 % gel  If you need refills please call your pharmacy. Do not use My Chart to request refills or for acute issues that need immediate attention.   Amlodipine 2.5 mg at bedtime. For now no changes in Lisinopril but we could decrease dose if blood pressure is low. Stop Hydrochlorothiazide.  Let me know about blood pressure readings in 3-4 weeks.  Start Furosemide 20 mg 1/2-1 tab daily in the morning as needed. Lower extremity elevation above heart level and compression stockings.  Please be sure medication list is accurate. If a new problem present, please set up appointment sooner than planned today.

## 2020-10-23 ENCOUNTER — Encounter: Payer: Self-pay | Admitting: Family Medicine

## 2020-10-24 ENCOUNTER — Encounter: Payer: Self-pay | Admitting: Family Medicine

## 2020-10-25 DIAGNOSIS — H2511 Age-related nuclear cataract, right eye: Secondary | ICD-10-CM | POA: Diagnosis not present

## 2020-10-25 DIAGNOSIS — H02831 Dermatochalasis of right upper eyelid: Secondary | ICD-10-CM | POA: Diagnosis not present

## 2020-10-25 DIAGNOSIS — H25043 Posterior subcapsular polar age-related cataract, bilateral: Secondary | ICD-10-CM | POA: Diagnosis not present

## 2020-10-25 DIAGNOSIS — H04123 Dry eye syndrome of bilateral lacrimal glands: Secondary | ICD-10-CM | POA: Diagnosis not present

## 2020-10-25 DIAGNOSIS — H2513 Age-related nuclear cataract, bilateral: Secondary | ICD-10-CM | POA: Diagnosis not present

## 2020-10-27 ENCOUNTER — Encounter: Payer: Self-pay | Admitting: Family Medicine

## 2020-10-27 ENCOUNTER — Other Ambulatory Visit: Payer: Self-pay | Admitting: Family Medicine

## 2020-10-27 DIAGNOSIS — I10 Essential (primary) hypertension: Secondary | ICD-10-CM

## 2020-10-27 MED ORDER — CHLORTHALIDONE 25 MG PO TABS: 25.0000 mg | ORAL_TABLET | Freq: Every day | ORAL | 1 refills | Status: DC

## 2020-10-28 ENCOUNTER — Other Ambulatory Visit: Payer: Self-pay | Admitting: Family Medicine

## 2020-10-28 DIAGNOSIS — R600 Localized edema: Secondary | ICD-10-CM

## 2020-10-28 DIAGNOSIS — I10 Essential (primary) hypertension: Secondary | ICD-10-CM

## 2020-11-18 ENCOUNTER — Other Ambulatory Visit: Payer: Self-pay | Admitting: Family Medicine

## 2020-11-18 DIAGNOSIS — I10 Essential (primary) hypertension: Secondary | ICD-10-CM

## 2020-11-24 ENCOUNTER — Other Ambulatory Visit: Payer: Self-pay

## 2020-11-27 ENCOUNTER — Other Ambulatory Visit: Payer: Self-pay

## 2020-11-27 ENCOUNTER — Other Ambulatory Visit (INDEPENDENT_AMBULATORY_CARE_PROVIDER_SITE_OTHER): Payer: Medicare PPO

## 2020-11-27 DIAGNOSIS — I10 Essential (primary) hypertension: Secondary | ICD-10-CM

## 2020-11-27 LAB — BASIC METABOLIC PANEL
BUN: 15 mg/dL (ref 6–23)
CO2: 30 mEq/L (ref 19–32)
Calcium: 9.3 mg/dL (ref 8.4–10.5)
Chloride: 103 mEq/L (ref 96–112)
Creatinine, Ser: 0.86 mg/dL (ref 0.40–1.20)
GFR: 65.73 mL/min (ref >60.00)
Glucose, Bld: 95 mg/dL (ref 70–99)
Potassium: 3.8 mEq/L (ref 3.5–5.1)
Sodium: 142 mEq/L (ref 135–145)

## 2020-11-28 DIAGNOSIS — H2511 Age-related nuclear cataract, right eye: Secondary | ICD-10-CM | POA: Diagnosis not present

## 2020-11-28 DIAGNOSIS — H2512 Age-related nuclear cataract, left eye: Secondary | ICD-10-CM | POA: Diagnosis not present

## 2020-12-05 DIAGNOSIS — H2512 Age-related nuclear cataract, left eye: Secondary | ICD-10-CM | POA: Diagnosis not present

## 2020-12-05 DIAGNOSIS — H5212 Myopia, left eye: Secondary | ICD-10-CM | POA: Diagnosis not present

## 2020-12-13 ENCOUNTER — Other Ambulatory Visit: Payer: Self-pay | Admitting: Family Medicine

## 2020-12-13 DIAGNOSIS — I10 Essential (primary) hypertension: Secondary | ICD-10-CM

## 2021-01-06 ENCOUNTER — Other Ambulatory Visit: Payer: Self-pay | Admitting: Family Medicine

## 2021-01-06 DIAGNOSIS — I10 Essential (primary) hypertension: Secondary | ICD-10-CM

## 2021-01-22 ENCOUNTER — Other Ambulatory Visit: Payer: Self-pay

## 2021-01-22 DIAGNOSIS — I10 Essential (primary) hypertension: Secondary | ICD-10-CM

## 2021-01-22 MED ORDER — CHLORTHALIDONE 25 MG PO TABS: 25.0000 mg | ORAL_TABLET | Freq: Every day | ORAL | 1 refills | Status: DC

## 2021-01-31 ENCOUNTER — Ambulatory Visit: Payer: Medicare PPO | Admitting: Family Medicine

## 2021-02-06 NOTE — Progress Notes (Signed)
[name] is a 75 y.o.female, who is here today to follow on HTN.  Last follow up visit: 08/01/20  Hypertension:  Medications:Chlorthalidone 25 mg 1/2 tab daily and Lisinopril 10 mg bid. BP readings at home:Most 120-130/60-70's. Side effects:None. Negative for unusual or severe headache, visual changes, exertional chest pain, dyspnea, or focal weakness.  Lab Results  Component Value Date   CREATININE 0.86 11/27/2020   BUN 15 11/27/2020   NA 142 11/27/2020   K 3.8 11/27/2020   CL 103 11/27/2020   CO2 30 11/27/2020   [place] extremity edema stable. She takes Furosemide 20 mg 1/2-1 tab daily prn, has not taken it is a while.  Anxiety: She is on Valium 5 mg 1/2-5 mg daily prn. Negative for depressed mood.  Tolerating medications well, no side effects.  Review of Systems  Constitutional:  Negative for activity change, appetite change, fatigue and fever.  HENT:  Negative for mouth sores, nosebleeds and trouble swallowing.   Respiratory:  Negative for cough and wheezing.   Gastrointestinal:  Negative for abdominal pain, nausea and vomiting.       Negative for changes in bowel habits.  Genitourinary:  Negative for decreased urine volume and hematuria.  Neurological:  Negative for syncope and facial asymmetry.  Psychiatric/Behavioral:  Negative for confusion.   Rest see pertinent positives and negatives per HPI.  Current Outpatient Medications on File Prior to Visit  Medication Sig Dispense Refill   aspirin EC 81 MG tablet Take 81 mg by mouth daily.     furosemide (LASIX) 20 MG tablet TAKE 0.5-1 TABLETS (10-20 MG TOTAL) BY MOUTH DAILY AS NEEDED. 90 tablet 1   lisinopril (ZESTRIL) 10 MG tablet Take 1 tablet (10 mg total) by mouth 2 (two) times daily. 180 tablet 3   metroNIDAZOLE (METROGEL) 1 % gel Apply topically daily. 45 g 0   triamcinolone cream (KENALOG) 0.1 % Apply 1 application topically 2 (two) times daily as needed. 30 g 2   TURMERIC PO Take 1 tablet by mouth  daily.     No current facility-administered medications on file prior to visit.   Past Medical History:  Diagnosis Date   Anxiety    Hyperlipidemia    Hypertension    Stroke (HCC)     Allergies  Allergen Reactions   Erythromycin     IV   Social History   Socioeconomic History   Marital status: Married    Spouse name: Danielle Trevino   Number of children: 0   Years of education: 16   Highest education level: Bachelor's degree (e.g., BA, AB, BS)  Occupational History   Occupation: retired  Tobacco Use   Smoking status: Former    Types: Cigarettes    Quit date: 05/19/2017    Years since quitting: 3.7   Smokeless tobacco: Never  Substance and Sexual Activity   Alcohol use: No   Drug use: No   Sexual activity: Not on file  Other Topics Concern   Not on file  Social History Narrative   Patient is right-handed. She lives with her husband in a one story house. She avoids caffeine. She does not exercise. She is a retired Programmer, systems.   Social Determinants of Health   Financial Resource Strain: Low Risk    Difficulty of Paying Living Expenses: Not hard at all  Food Insecurity: No Food Insecurity   Worried About Programme researcher, broadcasting/film/video in the Last Year: Never true   Ran Out of Food in the Last  Year: Never true  Transportation Needs: No Transportation Needs   Lack of Transportation (Medical): No   Lack of Transportation (Non-Medical): No  Physical Activity: Inactive   Days of Exercise per Week: 0 days   Minutes of Exercise per Session: 0 min  Stress: No Stress Concern Present   Feeling of Stress : Not at all  Social Connections: Moderately Integrated   Frequency of Communication with Friends and Family: More than three times a week   Frequency of Social Gatherings with Friends and Family: More than three times a week   Attends Religious Services: More than 4 times per year   Active Member of Clubs or Organizations: No   Attends Archivist Meetings: Never   Marital Status:  Married   Vitals:   02/07/21 1450  BP: 124/74  Pulse: 81  Resp: 16  SpO2: 98%   Body mass index is 32.4 kg/m.  Physical Exam Vitals and nursing note reviewed.  Constitutional:      General: She is not in acute distress.    Appearance: She is well-developed.  HENT:     Head: Normocephalic and atraumatic.     Mouth/Throat:     Mouth: Mucous membranes are moist.     Pharynx: Oropharynx is clear.  Eyes:     Conjunctiva/sclera: Conjunctivae normal.  Cardiovascular:     Rate and Rhythm: Normal rate and regular rhythm.     Pulses:          Dorsalis pedis pulses are 2+ on the right side and 2+ on the left side.     Heart sounds: No murmur heard. Pulmonary:     Effort: Pulmonary effort is normal. No respiratory distress.     Breath sounds: Normal breath sounds.  Abdominal:     Palpations: Abdomen is soft. There is no hepatomegaly or mass.     Tenderness: There is no abdominal tenderness.  Lymphadenopathy:     Cervical: No cervical adenopathy.  Skin:    General: Skin is warm.     Findings: No erythema or rash.  Neurological:     General: No focal deficit present.     Mental Status: She is alert and oriented to person, place, and time.     Cranial Nerves: No cranial nerve deficit.     Gait: Gait normal.  Psychiatric:     Comments: Well groomed, good eye contact.   ASSESSMENT AND PLAN:   [name] was seen today for follow-up.  Diagnoses and all orders for this visit:  Hypertension, essential, benign BP adequately controlled. Continue current management: Chlorthalidone 25 mg 1/2 tab daily and Lisinopril 10 mg bid. DASH/low salt diet to continue. Monitor BP at home.  -     chlorthalidone (HYGROTON) 25 MG tablet; Take 0.5 tablets (12.5 mg total) by mouth daily.  Anxiety disorder, unspecified type She has tolerated medication well, continue Valium 2.5-5 mg daily as needed.  -     diazepam (VALIUM) 5 MG tablet; Take 0.5-1 tablets (2.5-5 mg total) by mouth every 12  (twelve) hours as needed for anxiety.  Bilateral lower extremity edema Problem is stable. Continue Furosemide 20 mg daily prn. K+ rich diet to continue.  Return in about 6 months (around 08/08/2021) for cpe.  Danielle Trevino G. [place], MD  [facility]. [place] office.

## 2021-02-07 ENCOUNTER — Encounter: Payer: Self-pay | Admitting: Family Medicine

## 2021-02-07 ENCOUNTER — Ambulatory Visit: Payer: Medicare PPO | Admitting: Family Medicine

## 2021-02-07 VITALS — BP 124/74 | HR 81 | Resp 16 | Ht 62.0 in | Wt 177.1 lb

## 2021-02-07 DIAGNOSIS — F419 Anxiety disorder, unspecified: Secondary | ICD-10-CM | POA: Diagnosis not present

## 2021-02-07 DIAGNOSIS — I10 Essential (primary) hypertension: Secondary | ICD-10-CM

## 2021-02-07 DIAGNOSIS — R600 Localized edema: Secondary | ICD-10-CM

## 2021-02-07 MED ORDER — DIAZEPAM 5 MG PO TABS: 2.5000 mg | ORAL_TABLET | Freq: Two times a day (BID) | ORAL | 2 refills | Status: DC | PRN

## 2021-02-07 NOTE — Patient Instructions (Signed)
A few things to remember from today's visit:   Hypertension, essential, benign  Anxiety disorder, unspecified type - Plan: diazepam (VALIUM) 5 MG tablet  Bilateral lower extremity edema  If you need refills please call your pharmacy. Do not use My Chart to request refills or for acute issues that need immediate attention.   No changes today. I will see you back in 5-6 months for your physical and fasting labs.  Happy holidays!!!  Please be sure medication list is accurate. If a new problem present, please set up appointment sooner than planned today.

## 2021-02-08 MED ORDER — CHLORTHALIDONE 25 MG PO TABS: 12.5000 mg | ORAL_TABLET | Freq: Every day | ORAL | 1 refills | Status: DC

## 2021-03-08 ENCOUNTER — Ambulatory Visit (INDEPENDENT_AMBULATORY_CARE_PROVIDER_SITE_OTHER): Payer: Medicare PPO

## 2021-03-08 VITALS — Ht 62.0 in | Wt 177.0 lb

## 2021-03-08 DIAGNOSIS — Z0000 Encounter for general adult medical examination without abnormal findings: Secondary | ICD-10-CM

## 2021-03-08 NOTE — Progress Notes (Signed)
Subjective:   Danielle Trevino is a 76 y.o. female who presents for Medicare Annual (Subsequent) preventive examination.  Review of Systems    No ROS Cardiac Risk Factors include: advanced age (>77men, >46 women);hypertension    Objective:    Today's Vitals   03/08/21 1520  Weight: 177 lb (80.3 kg)  Height: 5\' 2"  (1.575 m)   Body mass index is 32.37 kg/m.  Advanced Directives 03/08/2021 03/17/2020 03/17/2019  Does Patient Have a Medical Advance Directive? Yes Yes Yes  Type of 03/19/2019 of [place];Living will Healthcare Power of [place];Living will Healthcare Power of [place];Living will  Does patient want to make changes to medical advance directive? No - Patient declined No - Patient declined No - Patient declined  Copy of Healthcare Power of Attorney in Chart? No - copy requested No - copy requested No - copy requested    Current Medications (verified) Outpatient Encounter Medications as of 03/08/2021  Medication Sig   aspirin EC 81 MG tablet Take 81 mg by mouth daily.   chlorthalidone (HYGROTON) 25 MG tablet Take 0.5 tablets (12.5 mg total) by mouth daily.   diazepam (VALIUM) 5 MG tablet Take 0.5-1 tablets (2.5-5 mg total) by mouth every 12 (twelve) hours as needed for anxiety.   furosemide (LASIX) 20 MG tablet TAKE 0.5-1 TABLETS (10-20 MG TOTAL) BY MOUTH DAILY AS NEEDED.   lisinopril (ZESTRIL) 10 MG tablet Take 1 tablet (10 mg total) by mouth 2 (two) times daily.   metroNIDAZOLE (METROGEL) 1 % gel Apply topically daily.   triamcinolone cream (KENALOG) 0.1 % Apply 1 application topically 2 (two) times daily as needed.   TURMERIC PO Take 1 tablet by mouth daily.   No facility-administered encounter medications on file as of 03/08/2021.    Allergies (verified) Erythromycin   History: Past Medical History:  Diagnosis Date   Anxiety    Hyperlipidemia    Hypertension    Stroke [facility])    Past Surgical History:  Procedure Laterality Date    APPENDECTOMY     EYE SURGERY     TONSILLECTOMY     Family History  Problem Relation Age of Onset   Arthritis Mother    Heart disease Mother    Arthritis Father    Heart disease Father    Diabetes Maternal Grandfather    Social History   Socioeconomic History   Marital status: Married    Spouse name: [facility]   Number of children: 0   Years of education: 16   Highest education level: Bachelor's degree (e.g., BA, AB, BS)  Occupational History   Occupation: retired  Tobacco Use   Smoking status: Former    Types: Cigarettes    Quit date: 05/19/2017    Years since quitting: 3.8   Smokeless tobacco: Never  Substance and Sexual Activity   Alcohol use: No   Drug use: Yes    Types: Benzodiazepines   Sexual activity: Not on file  Other Topics Concern   Not on file  Social History Narrative   Patient is right-handed. She lives with her husband in a one story house. She avoids caffeine. She does not exercise. She is a retired 07/19/2017.   Social Determinants of Health   Financial Resource Strain: Low Risk    Difficulty of Paying Living Expenses: Not hard at all  Food Insecurity: No Food Insecurity   Worried About Programmer, systems in the Last Year: Never true   Ran Out of Food in the  Last Year: Never true  Transportation Needs: No Transportation Needs   Lack of Transportation (Medical): No   Lack of Transportation (Non-Medical): No  Physical Activity: Inactive   Days of Exercise per Week: 0 days   Minutes of Exercise per Session: 0 min  Stress: No Stress Concern Present   Feeling of Stress : Not at all  Social Connections: Socially Integrated   Frequency of Communication with Friends and Family: More than three times a week   Frequency of Social Gatherings with Friends and Family: More than three times a week   Attends Religious Services: More than 4 times per year   Active Member of [employer] or Organizations: Yes   Attends Banker Meetings: More than 4 times per  year   Marital Status: Married    Clinical Intake: How often do you need to have someone help you when you read instructions, pamphlets, or other written materials from your doctor or pharmacy?: 1 - Never  Diabetic? No   Interpreter Needed?: NoActivities of Daily Living In your present state of health, do you have any difficulty performing the following activities: 03/08/2021 03/17/2020  Hearing? N Y  Comment - Has bilateral hearing aids  Vision? N N  Difficulty concentrating or making decisions? N N  Walking or climbing stairs? N N  Dressing or bathing? N N  Doing errands, shopping? N N  Preparing Food and eating ? N N  Using the Toilet? N N  In the past six months, have you accidently leaked urine? N N  Do you have problems with loss of bowel control? N N  Managing your Medications? N N  Managing your Finances? N N  Housekeeping or managing your Housekeeping? N N  Some recent data might be hidden    Patient Care Team: [place], Betty G, MD as PCP - General (Family Medicine)  Indicate any recent Medical Services you may have received from other than [facility] providers in the past year (date may be approximate).     Assessment:   This is a routine wellness examination for [name].  Virtual Visit via Telephone Note  I connected with  Danielle Trevino on 03/08/21 at  3:15 PM EST by telephone and verified that I am speaking with the correct person using two identifiers.  Location: Patient: Home Provider: Office Persons participating in the virtual visit: patient/Nurse Health Advisor   I discussed the limitations, risks, security and privacy concerns of performing an evaluation and management service by telephone and the availability of in person appointments. The patient expressed understanding and agreed to proceed.  Interactive audio and video telecommunications were attempted between this nurse and patient, however failed, due to patient having technical difficulties OR patient  did not have access to video capability.  We continued and completed visit with audio only.  Some vital signs may be absent or patient reported.   Danielle Rung, LPN  Hearing/Vision screen Hearing Screening - Comments:: Wears hearing aids Vision Screening - Comments:: Wears reading glasses. Followed by Mannie Stabile  Dietary issues and exercise activities discussed: Current Exercise Habits: The patient does not participate in regular exercise at present   Goals Addressed   None    Depression Screen PHQ 2/9 Scores 03/08/2021 02/06/2021 03/17/2020 03/17/2019 03/15/2018 02/28/2017  PHQ - 2 Score 0 0 0 0 0 0    Fall Risk Fall Risk  03/08/2021 02/06/2021 03/17/2020 03/17/2019 03/15/2018  Falls in the past year? 0 0 0 0 0  Number falls  in past yr: 0 0 0 - 0  Injury with Fall? 0 0 0 - 0  Risk for fall due to : - - No Fall Risks Medication side effect -  Follow up - Education provided Falls evaluation completed;Falls prevention discussed Falls evaluation completed;Education provided;Falls prevention discussed Education provided    FALL RISK PREVENTION PERTAINING TO THE HOME:  Any stairs in or around the home? No  If so, are there any without handrails? No  Home free of loose throw rugs in walkways, pet beds, electrical cords, etc? Yes  Adequate lighting in your home to reduce risk of falls? Yes   ASSISTIVE DEVICES UTILIZED TO PREVENT FALLS:  Life alert? No  Use of a cane, walker or w/c? No  Grab bars in the bathroom? Yes  Shower chair or bench in shower? Yes  Elevated toilet seat or a handicapped toilet? Yes   TIMED UP AND GO:  Was the test performed? No . Audio Visit  Cognitive Function:     6CIT Screen 03/08/2021 03/17/2019  What Year? 0 points 0 points  What month? 0 points 0 points  What time? 0 points 0 points  Count back from 20 0 points 0 points  Months in reverse 0 points 0 points  Repeat phrase 0 points 0 points  Total Score 0 0    Immunizations  There is no  immunization history on file for this patient.   Screening Tests Health Maintenance  Topic Date Due   HPV VACCINES  Aged Out   Pneumonia Vaccine 4565+ Years old  Discontinued   INFLUENZA VACCINE  Discontinued   DEXA SCAN  Discontinued   TETANUS/TDAP  Discontinued   COVID-19 Vaccine  Discontinued   Hepatitis C Screening  Discontinued   Zoster Vaccines- Shingrix  Discontinued    Health Maintenance   Additional Screening:  Vision Screening: Recommended annual ophthalmology exams for early detection of glaucoma and other disorders of the eye. Is the patient up to date with their annual eye exam?  Yes  Who is the provider or what is the name of the office in which the patient attends annual eye exams? Followed By Mannie StabileStacey Hutto  Dental Screening: Recommended annual dental exams for proper oral hygiene  Community Resource Referral / Chronic Care Management:  CRR required this visit?  No   CCM required this visit?  No      Plan:     I have personally reviewed and noted the following in the patients chart:   Medical and social history Use of alcohol, tobacco or illicit drugs  Current medications and supplements including opioid prescriptions. Patient currentlynot takingopioids Functional ability and status Nutritional status Physical activity Advanced directives List of other physicians Hospitalizations, surgeries, and ER visits in previous 12 months Vitals Screenings to include cognitive, depression, and falls Referrals and appointments  In addition, I have reviewed and discussed with patient certain preventive protocols, quality metrics, and best practice recommendations. A written personalized care plan for preventive services as well as general preventive health recommendations were provided to patient.     Danielle RungBeverly W Diaz Crago, LPN   1/61/09601/19/2023

## 2021-03-08 NOTE — Patient Instructions (Addendum)
Danielle Trevino , Thank you for taking time to come for your Medicare Wellness Visit. I appreciate your ongoing commitment to your health goals. Please review the following plan we discussed and let me know if I can assist you in the future.   These are the goals we discussed:  Goals      Exercise 150 min/wk Moderate Activity     stop having visual aura's        This is a list of the screening recommended for you and due dates:  Health Maintenance  Topic Date Due   HPV Vaccine  Aged Out   Pneumonia Vaccine  Discontinued   Flu Shot  Discontinued   DEXA scan (bone density measurement)  Discontinued   Tetanus Vaccine  Discontinued   COVID-19 Vaccine  Discontinued   Hepatitis C Screening: USPSTF Recommendation to screen - Ages 89-79 yo.  Discontinued   Zoster (Shingles) Vaccine  Discontinued   Advanced directives: Yes  Conditions/risks identified: None  Next appointment: Follow up in one year for your annual wellness visit    Preventive Care 65 Years and Older, Female Preventive care refers to lifestyle choices and visits with your health care provider that can promote health and wellness. What does preventive care include? A yearly physical exam. This is also called an annual well check. Dental exams once or twice a year. Routine eye exams. Ask your health care provider how often you should have your eyes checked. Personal lifestyle choices, including: Daily care of your teeth and gums. Regular physical activity. Eating a healthy diet. Avoiding tobacco and drug use. Limiting alcohol use. Practicing safe sex. Taking low-dose aspirin every day. Taking vitamin and mineral supplements as recommended by your health care provider. What happens during an annual well check? The services and screenings done by your health care provider during your annual well check will depend on your age, overall health, lifestyle risk factors, and family history of disease. Counseling  Your health  care provider may ask you questions about your: Alcohol use. Tobacco use. Drug use. Emotional well-being. Home and relationship well-being. Sexual activity. Eating habits. History of falls. Memory and ability to understand (cognition). Work and work Astronomer. Reproductive health. Screening  You may have the following tests or measurements: Height, weight, and BMI. Blood pressure. Lipid and cholesterol levels. These may be checked every 5 years, or more frequently if you are over 20 years old. Skin check. Lung cancer screening. You may have this screening every year starting at age 75 if you have a 30-pack-year history of smoking and currently smoke or have quit within the past 15 years. Fecal occult blood test (FOBT) of the stool. You may have this test every year starting at age 82. Flexible sigmoidoscopy or colonoscopy. You may have a sigmoidoscopy every 5 years or a colonoscopy every 10 years starting at age 64. Hepatitis C blood test. Hepatitis B blood test. Sexually transmitted disease (STD) testing. Diabetes screening. This is done by checking your blood sugar (glucose) after you have not eaten for a while (fasting). You may have this done every 1-3 years. Bone density scan. This is done to screen for osteoporosis. You may have this done starting at age 50. Mammogram. This may be done every 1-2 years. Talk to your health care provider about how often you should have regular mammograms. Talk with your health care provider about your test results, treatment options, and if necessary, the need for more tests. Vaccines  Your health care provider may  recommend certain vaccines, such as: Influenza vaccine. This is recommended every year. Tetanus, diphtheria, and acellular pertussis (Tdap, Td) vaccine. You may need a Td booster every 10 years. Zoster vaccine. You may need this after age 79. Pneumococcal 13-valent conjugate (PCV13) vaccine. One dose is recommended after age  34. Pneumococcal polysaccharide (PPSV23) vaccine. One dose is recommended after age 44. Talk to your health care provider about which screenings and vaccines you need and how often you need them. This information is not intended to replace advice given to you by your health care provider. Make sure you discuss any questions you have with your health care provider. Document Released: 03/03/2015 Document Revised: 10/25/2015 Document Reviewed: 12/06/2014 Elsevier Interactive Patient Education  2017 [place] Prevention in the Home Falls can cause injuries. They can happen to people of all ages. There are many things you can do to make your home safe and to help prevent falls. What can I do on the outside of my home? Regularly fix the edges of walkways and driveways and fix any cracks. Remove anything that might make you trip as you walk through a door, such as a raised step or threshold. Trim any bushes or trees on the path to your home. Use bright outdoor lighting. Clear any walking paths of anything that might make someone trip, such as rocks or tools. Regularly check to see if handrails are loose or broken. Make sure that both sides of any steps have handrails. Any raised decks and porches should have guardrails on the edges. Have any leaves, snow, or ice cleared regularly. Use sand or salt on walking paths during winter. Clean up any spills in your garage right away. This includes oil or grease spills. What can I do in the bathroom? Use night lights. Install grab bars by the toilet and in the tub and shower. Do not use towel bars as grab bars. Use non-skid mats or decals in the tub or shower. If you need to sit down in the shower, use a plastic, non-slip stool. Keep the floor dry. Clean up any water that spills on the floor as soon as it happens. Remove soap buildup in the tub or shower regularly. Attach bath mats securely with double-sided non-slip rug tape. Do not have throw  rugs and other things on the floor that can make you trip. What can I do in the bedroom? Use night lights. Make sure that you have a light by your bed that is easy to reach. Do not use any sheets or blankets that are too big for your bed. They should not hang down onto the floor. Have a firm chair that has side arms. You can use this for support while you get dressed. Do not have throw rugs and other things on the floor that can make you trip. What can I do in the kitchen? Clean up any spills right away. Avoid walking on wet floors. Keep items that you use a lot in easy-to-reach places. If you need to reach something above you, use a strong step stool that has a grab bar. Keep electrical cords out of the way. Do not use floor polish or wax that makes floors slippery. If you must use wax, use non-skid floor wax. Do not have throw rugs and other things on the floor that can make you trip. What can I do with my stairs? Do not leave any items on the stairs. Make sure that there are handrails on both sides of  the stairs and use them. Fix handrails that are broken or loose. Make sure that handrails are as long as the stairways. Check any carpeting to make sure that it is firmly attached to the stairs. Fix any carpet that is loose or worn. Avoid having throw rugs at the top or bottom of the stairs. If you do have throw rugs, attach them to the floor with carpet tape. Make sure that you have a light switch at the top of the stairs and the bottom of the stairs. If you do not have them, ask someone to add them for you. What else can I do to help prevent falls? Wear shoes that: Do not have high heels. Have rubber bottoms. Are comfortable and fit you well. Are closed at the toe. Do not wear sandals. If you use a stepladder: Make sure that it is fully opened. Do not climb a closed stepladder. Make sure that both sides of the stepladder are locked into place. Ask someone to hold it for you, if  possible. Clearly mark and make sure that you can see: Any grab bars or handrails. First and last steps. Where the edge of each step is. Use tools that help you move around (mobility aids) if they are needed. These include: Canes. Walkers. Scooters. Crutches. Turn on the lights when you go into a dark area. Replace any light bulbs as soon as they burn out. Set up your furniture so you have a clear path. Avoid moving your furniture around. If any of your floors are uneven, fix them. If there are any pets around you, be aware of where they are. Review your medicines with your doctor. Some medicines can make you feel dizzy. This can increase your chance of falling. Ask your doctor what other things that you can do to help prevent falls. This information is not intended to replace advice given to you by your health care provider. Make sure you discuss any questions you have with your health care provider. Document Released: 12/01/2008 Document Revised: 07/13/2015 Document Reviewed: 03/11/2014 Elsevier Interactive Patient Education  2017 [employer].

## 2021-04-17 NOTE — Progress Notes (Signed)
HPI: Danielle Trevino is a 76 y.o. female, who is here today with her husband for her routine physical.  Last CPE: 04/17/20  Regular exercise 3 or more time per week: Active around the house and parking far from stores. Following a healthy diet: She cooks at home, she eats fish 2 times per week and not a lot pork or red meat.She is concerned about not being able to lose weight, she acknowledges she has a problem with portions. She cooks at home most of the time, which she really enjoys.  She lives with her husband.   Chronic medical problems: CVA,HTN,HLD,anxiety,LE vein disease,and SK among some.   There is no immunization history on file for this patient. Health Maintenance  Topic Date Due   HPV VACCINES  Aged Out   Pneumonia Vaccine 16+ Years old  Discontinued   INFLUENZA VACCINE  Discontinued   DEXA SCAN  Discontinued   TETANUS/TDAP  Discontinued   COVID-19 Vaccine  Discontinued   Hepatitis C Screening  Discontinued   Zoster Vaccines- Shingrix  Discontinued   She has no concerns today. Hyperlipidemia: Currently she is on nonpharmacologic treatment.  Lab Results  Component Value Date   CHOL 181 04/17/2020   HDL 49.70 04/17/2020   LDLCALC 95 04/17/2020   TRIG 182.0 (H) 04/17/2020   CHOLHDL 4 04/17/2020   Hypertension: She is on lisinopril 10 mg daily and chlorthalidone 25 mg 1/2 tablet daily. She is tolerating medication well. Home BP readings:110's-130's/60-70's most of the time. Occasionally SBP 140's.  Lab Results  Component Value Date   CREATININE 0.86 11/27/2020   BUN 15 11/27/2020   NA 142 11/27/2020   K 3.8 11/27/2020   CL 103 11/27/2020   CO2 30 11/27/2020   Anxiety: Currently she is on Valium 5 mg daily as needed, she does not take medication very often. Her husband would like to have vitamin D check, no history of vitamin D deficiency. 25 [place] vitamin D was normal at 52 on 04/17/20.  Review of Systems  Constitutional:  Negative for appetite change,  fatigue and fever.  HENT:  Negative for hearing loss, mouth sores and sore throat.   Eyes:  Negative for redness and visual disturbance.  Respiratory:  Negative for cough, shortness of breath and wheezing.   Cardiovascular:  Negative for chest pain and leg swelling.  Gastrointestinal:  Negative for abdominal pain, nausea and vomiting.       No changes in bowel habits.  Endocrine: Negative for cold intolerance, heat intolerance, polydipsia, polyphagia and polyuria.  Genitourinary:  Negative for decreased urine volume, dysuria, hematuria, vaginal bleeding and vaginal discharge.  Musculoskeletal:  Negative for gait problem and myalgias.  Skin:  Negative for color change and rash.  Allergic/Immunologic: Negative for environmental allergies.  Neurological:  Negative for syncope, weakness and headaches.  Hematological:  Negative for adenopathy. Does not bruise/bleed easily.  Psychiatric/Behavioral:  Negative for confusion and sleep disturbance.   All other systems reviewed and are negative.  Current Outpatient Medications on File Prior to Visit  Medication Sig Dispense Refill   aspirin EC 81 MG tablet Take 81 mg by mouth daily.     diazepam (VALIUM) 5 MG tablet Take 0.5-1 tablets (2.5-5 mg total) by mouth every 12 (twelve) hours as needed for anxiety. 20 tablet 2   furosemide (LASIX) 20 MG tablet TAKE 0.5-1 TABLETS (10-20 MG TOTAL) BY MOUTH DAILY AS NEEDED. 90 tablet 1   TURMERIC PO Take 1 tablet by mouth daily.  No current facility-administered medications on file prior to visit.   Past Medical History:  Diagnosis Date   Anxiety    Hyperlipidemia    Hypertension    Stroke [facility])    Past Surgical History:  Procedure Laterality Date   APPENDECTOMY     EYE SURGERY     TONSILLECTOMY      Allergies  Allergen Reactions   Erythromycin     IV    Family History  Problem Relation Age of Onset   Arthritis Mother    Heart disease Mother    Arthritis Father    Heart disease Father     Diabetes Maternal Grandfather     Social History   Socioeconomic History   Marital status: Married    Spouse name: Quita Skye   Number of children: 0   Years of education: 16   Highest education level: Bachelor's degree (e.g., BA, AB, BS)  Occupational History   Occupation: retired  Tobacco Use   Smoking status: Former    Types: Cigarettes    Quit date: 05/19/2017    Years since quitting: 3.9   Smokeless tobacco: Never  Substance and Sexual Activity   Alcohol use: No   Drug use: Yes    Types: Benzodiazepines   Sexual activity: Not on file  Other Topics Concern   Not on file  Social History Narrative   Patient is right-handed. She lives with her husband in a one story house. She avoids caffeine. She does not exercise. She is a retired Tourist information centre manager.   Social Determinants of Health   Financial Resource Strain: Low Risk    Difficulty of Paying Living Expenses: Not hard at all  Food Insecurity: No Food Insecurity   Worried About Charity fundraiser in the Last Year: Never true   [place] in the Last Year: Never true  Transportation Needs: No Transportation Needs   Lack of Transportation (Medical): No   Lack of Transportation (Non-Medical): No  Physical Activity: Inactive   Days of Exercise per Week: 0 days   Minutes of Exercise per Session: 0 min  Stress: No Stress Concern Present   Feeling of Stress : Not at all  Social Connections: Socially Integrated   Frequency of Communication with Friends and Family: More than three times a week   Frequency of Social Gatherings with Friends and Family: More than three times a week   Attends Religious Services: More than 4 times per year   Active Member of [employer] or Organizations: Yes   Attends Archivist Meetings: More than 4 times per year   Marital Status: Married   Vitals:   04/18/21 0949  BP: 126/80  Pulse: 88  Resp: 16  SpO2: 97%   Body mass index is 32.6 kg/m.  Wt Readings from Last 3 Encounters:  04/18/21  178 lb 4 oz (80.9 kg)  03/08/21 177 lb (80.3 kg)  02/07/21 177 lb 2 oz (80.3 kg)   Physical Exam Vitals and nursing note reviewed.  Constitutional:      General: She is not in acute distress.    Appearance: She is well-developed.  HENT:     Head: Normocephalic and atraumatic.     Right Ear: Tympanic membrane, ear canal and external ear normal.     Left Ear: Tympanic membrane, ear canal and external ear normal.     Ears:     Comments: Hearing aid, bilateral.    Mouth/Throat:     Mouth: Mucous membranes are  moist.     Pharynx: Oropharynx is clear. Uvula midline.  Eyes:     Conjunctiva/sclera: Conjunctivae normal.     Pupils: Pupils are equal, round, and reactive to light.  Cardiovascular:     Rate and Rhythm: Normal rate and regular rhythm.     Pulses:          Dorsalis pedis pulses are 2+ on the right side and 2+ on the left side.     Heart sounds: No murmur heard. Pulmonary:     Effort: Pulmonary effort is normal. No respiratory distress.     Breath sounds: Normal breath sounds.  Abdominal:     Palpations: Abdomen is soft. There is no hepatomegaly or mass.     Tenderness: There is no abdominal tenderness.  Musculoskeletal:     Comments: No signs of synovitis appreciated.  Lymphadenopathy:     Cervical: No cervical adenopathy.  Skin:    General: Skin is warm.     Findings: No erythema or rash.  Neurological:     General: No focal deficit present.     Mental Status: She is alert and oriented to person, place, and time.     Cranial Nerves: No cranial nerve deficit.     Coordination: Coordination normal.     Gait: Gait normal.     Deep Tendon Reflexes:     Reflex Scores:      Bicep reflexes are 2+ on the right side and 2+ on the left side.      Patellar reflexes are 2+ on the right side and 2+ on the left side. Psychiatric:        Speech: Speech normal.     Comments: Well groomed, good eye contact.   ASSESSMENT AND PLAN:  Danielle Trevino was here today annual  physical examination.  Orders Placed This Encounter  Procedures   Lipid panel   Comprehensive metabolic panel   Lab Results  Component Value Date   CREATININE 0.84 04/18/2021   BUN 17 04/18/2021   NA 139 04/18/2021   K 3.7 04/18/2021   CL 103 04/18/2021   CO2 31 04/18/2021   Lab Results  Component Value Date   ALT 24 04/18/2021   AST 25 04/18/2021   ALKPHOS 68 04/18/2021   BILITOT 0.6 04/18/2021   Lab Results  Component Value Date   CHOL 206 (H) 04/18/2021   HDL 48.30 04/18/2021   LDLCALC 125 (H) 04/18/2021   TRIG 162.0 (H) 04/18/2021   CHOLHDL 4 04/18/2021   Routine general medical examination at a health care facility We discussed the importance of regular physical activity and healthy diet for prevention of chronic illness and/or complications. Preventive guidelines reviewed. Vaccination: Not interested in vaccination at this time. Ca++ and vit D supplementation to continue. Next CPE in a year.  Hypertension, essential, benign BP adequately controlled. Continue current management. Low salt/DASH diet to continue.  Hyperlipidemia We discussed CV benefits of statins, she is not interested in pharmacologic treatment. Continue low fat diet.  Anxiety disorder, unspecified Stable. Continue Valium 5 mg daily prn.  Return in 6 months (on 10/19/2021) for HTN and anxiety.  Anes Rigel G. [place], MD  [facility]. [place] office.

## 2021-04-17 NOTE — Patient Instructions (Addendum)
A few things to remember from today's visit: ? ?Routine general medical examination at a health care facility ? ?Hyperlipidemia, unspecified hyperlipidemia type - Plan: Lipid panel, Comprehensive metabolic panel ? ?Hypertension, essential, benign - Plan: Comprehensive metabolic panel, lisinopril (ZESTRIL) 10 MG tablet, chlorthalidone (HYGROTON) 25 MG tablet ? ?If you need refills please call your pharmacy. ?Do not use My Chart to request refills or for acute issues that need immediate attention. ?  ?Please be sure medication list is accurate. ?If a new problem present, please set up appointment sooner than planned today. ? ?Preventive Care 24 Years and Older, Female ?Preventive care refers to lifestyle choices and visits with your health care provider that can promote health and wellness. Preventive care visits are also called wellness exams. ?What can I expect for my preventive care visit? ?Counseling ?Your health care provider may ask you questions about your: ?Medical history, including: ?Past medical problems. ?Family medical history. ?Pregnancy and menstrual history. ?History of falls. ?Current health, including: ?Memory and ability to understand (cognition). ?Emotional well-being. ?Home life and relationship well-being. ?Sexual activity and sexual health. ?Lifestyle, including: ?Alcohol, nicotine or tobacco, and drug use. ?Access to firearms. ?Diet, exercise, and sleep habits. ?Work and work Statistician. ?Sunscreen use. ?Safety issues such as seatbelt and bike helmet use. ?Physical exam ?Your health care provider will check your: ?Height and weight. These may be used to calculate your BMI (body mass index). BMI is a measurement that tells if you are at a healthy weight. ?Waist circumference. This measures the distance around your waistline. This measurement also tells if you are at a healthy weight and may help predict your risk of certain diseases, such as type 2 diabetes and high blood pressure. ?Heart rate  and blood pressure. ?Body temperature. ?Skin for abnormal spots. ?What immunizations do I need? ?Vaccines are usually given at various ages, according to a schedule. Your health care provider will recommend vaccines for you based on your age, medical history, and lifestyle or other factors, such as travel or where you work. ?What tests do I need? ?Screening ?Your health care provider may recommend screening tests for certain conditions. This may include: ?Lipid and cholesterol levels. ?Hepatitis C test. ?Hepatitis B test. ?HIV (human immunodeficiency virus) test. ?STI (sexually transmitted infection) testing, if you are at risk. ?Lung cancer screening. ?Colorectal cancer screening. ?Diabetes screening. This is done by checking your blood sugar (glucose) after you have not eaten for a while (fasting). ?Mammogram. Talk with your health care provider about how often you should have regular mammograms. ?BRCA-related cancer screening. This may be done if you have a family history of breast, ovarian, tubal, or peritoneal cancers. ?Bone density scan. This is done to screen for osteoporosis. ?Talk with your health care provider about your test results, treatment options, and if necessary, the need for more tests. ?Follow these instructions at home: ?Eating and drinking ? ?Eat a diet that includes fresh fruits and vegetables, whole grains, lean protein, and low-fat dairy products. Limit your intake of foods with high amounts of sugar, saturated fats, and salt. ?Take vitamin and mineral supplements as recommended by your health care provider. ?Do not drink alcohol if your health care provider tells you not to drink. ?If you drink alcohol: ?Limit how much you have to 0-1 drink a day. ?Know how much alcohol is in your drink. In the [place] one drink equals one 12 oz bottle of beer (355 mL), one 5 oz glass of wine (148 mL), or one 1? oz glass  of hard liquor (44 mL). ?Lifestyle ?Brush your teeth every morning and night with  fluoride toothpaste. Floss one time each day. ?Exercise for at least 30 minutes 5 or more days each week. ?Do not use any products that contain nicotine or tobacco. These products include cigarettes, chewing tobacco, and vaping devices, such as e-cigarettes. If you need help quitting, ask your health care provider. ?Do not use drugs. ?If you are sexually active, practice safe sex. Use a condom or other form of protection in order to prevent STIs. ?Take aspirin only as told by your health care provider. Make sure that you understand how much to take and what form to take. Work with your health care provider to find out whether it is safe and beneficial for you to take aspirin daily. ?Ask your health care provider if you need to take a cholesterol-lowering medicine (statin). ?Find healthy ways to manage stress, such as: ?Meditation, yoga, or listening to music. ?Journaling. ?Talking to a trusted person. ?Spending time with friends and family. ?Minimize exposure to UV radiation to reduce your risk of skin cancer. ?Safety ?Always wear your seat belt while driving or riding in a vehicle. ?Do not drive: ?If you have been drinking alcohol. Do not ride with someone who has been drinking. ?When you are tired or distracted. ?While texting. ?If you have been using any mind-altering substances or drugs. ?Wear a helmet and other protective equipment during sports activities. ?If you have firearms in your house, make sure you follow all gun safety procedures. ?What's next? ?Visit your health care provider once a year for an annual wellness visit. ?Ask your health care provider how often you should have your eyes and teeth checked. ?Stay up to date on all vaccines. ?This information is not intended to replace advice given to you by your health care provider. Make sure you discuss any questions you have with your health care provider. ?Document Revised: 08/02/2020 Document Reviewed: 08/02/2020 ?Elsevier Patient Education ? 2022  [place]. ? ? ? ? ? ? ?

## 2021-04-18 ENCOUNTER — Other Ambulatory Visit: Payer: Self-pay

## 2021-04-18 ENCOUNTER — Encounter: Payer: Self-pay | Admitting: Family Medicine

## 2021-04-18 ENCOUNTER — Ambulatory Visit (INDEPENDENT_AMBULATORY_CARE_PROVIDER_SITE_OTHER): Payer: Medicare PPO | Admitting: Family Medicine

## 2021-04-18 VITALS — BP 126/80 | HR 88 | Resp 16 | Ht 62.0 in | Wt 178.2 lb

## 2021-04-18 DIAGNOSIS — E785 Hyperlipidemia, unspecified: Secondary | ICD-10-CM | POA: Diagnosis not present

## 2021-04-18 DIAGNOSIS — Z0000 Encounter for general adult medical examination without abnormal findings: Secondary | ICD-10-CM

## 2021-04-18 DIAGNOSIS — F419 Anxiety disorder, unspecified: Secondary | ICD-10-CM

## 2021-04-18 DIAGNOSIS — I10 Essential (primary) hypertension: Secondary | ICD-10-CM

## 2021-04-18 LAB — COMPREHENSIVE METABOLIC PANEL
ALT: 24 U/L (ref 0–35)
AST: 25 U/L (ref 0–37)
Albumin: 4 g/dL (ref 3.5–5.2)
Alkaline Phosphatase: 68 U/L (ref 39–117)
BUN: 17 mg/dL (ref 6–23)
CO2: 31 mEq/L (ref 19–32)
Calcium: 9.2 mg/dL (ref 8.4–10.5)
Chloride: 103 mEq/L (ref 96–112)
Creatinine, Ser: 0.84 mg/dL (ref 0.40–1.20)
GFR: 67.43 mL/min (ref >60.00)
Glucose, Bld: 99 mg/dL (ref 70–99)
Potassium: 3.7 mEq/L (ref 3.5–5.1)
Sodium: 139 mEq/L (ref 135–145)
Total Bilirubin: 0.6 mg/dL (ref 0.2–1.2)
Total Protein: 6.5 g/dL (ref 6.0–8.3)

## 2021-04-18 LAB — LIPID PANEL
Cholesterol: 206 mg/dL — ABNORMAL HIGH (ref 0–200)
HDL: 48.3 mg/dL (ref >39.00)
LDL Cholesterol: 125 mg/dL — ABNORMAL HIGH (ref 0–99)
NonHDL: 157.84
Total CHOL/HDL Ratio: 4
Triglycerides: 162 mg/dL — ABNORMAL HIGH (ref 0.0–149.0)
VLDL: 32.4 mg/dL (ref 0.0–40.0)

## 2021-04-18 MED ORDER — LISINOPRIL 10 MG PO TABS
10.0000 mg | ORAL_TABLET | Freq: Every day | ORAL | 3 refills | Status: DC
Start: 2021-04-18 — End: 2021-05-14

## 2021-04-18 MED ORDER — CHLORTHALIDONE 25 MG PO TABS: 12.5000 mg | ORAL_TABLET | Freq: Every day | ORAL | 1 refills | Status: DC

## 2021-04-18 NOTE — Assessment & Plan Note (Signed)
We discussed CV benefits of statins, she is not interested in pharmacologic treatment. ?Continue low fat diet. ?

## 2021-04-18 NOTE — Assessment & Plan Note (Signed)
BP adequately controlled. ?Continue current management. ?Low salt/DASH diet to continue. ?

## 2021-04-18 NOTE — Assessment & Plan Note (Signed)
Stable. ?Continue Valium 5 mg daily prn. ?

## 2021-05-14 ENCOUNTER — Other Ambulatory Visit: Payer: Self-pay | Admitting: Family Medicine

## 2021-05-14 DIAGNOSIS — I10 Essential (primary) hypertension: Secondary | ICD-10-CM

## 2021-07-02 DIAGNOSIS — H35033 Hypertensive retinopathy, bilateral: Secondary | ICD-10-CM | POA: Diagnosis not present

## 2021-10-19 ENCOUNTER — Ambulatory Visit: Payer: Medicare PPO | Admitting: Family Medicine

## 2021-10-23 ENCOUNTER — Ambulatory Visit: Payer: Medicare PPO | Admitting: Family Medicine

## 2021-10-23 ENCOUNTER — Encounter: Payer: Self-pay | Admitting: Family Medicine

## 2021-10-23 VITALS — BP 128/80 | HR 83 | Temp 98.5°F | Resp 12 | Ht 62.0 in | Wt 167.4 lb

## 2021-10-23 DIAGNOSIS — F419 Anxiety disorder, unspecified: Secondary | ICD-10-CM

## 2021-10-23 DIAGNOSIS — E785 Hyperlipidemia, unspecified: Secondary | ICD-10-CM

## 2021-10-23 DIAGNOSIS — I10 Essential (primary) hypertension: Secondary | ICD-10-CM | POA: Diagnosis not present

## 2021-10-23 MED ORDER — DIAZEPAM 5 MG PO TABS: 2.5000 mg | ORAL_TABLET | Freq: Two times a day (BID) | ORAL | 2 refills | Status: DC | PRN

## 2021-10-23 NOTE — Progress Notes (Signed)
HPI: Danielle Trevino is a very pleasant 76 y.o. female, who is here today with her husband for chronic disease management.  Last seen on 04/18/21. No new problems since her last visit. She is following a healthier diet, decreased carb intake and has lost some wt.  BP has improved. She stopped Lisinopril 10 mg and decreased Chlorthalidone 25 mg 1/2 tab from daily to 3 times per week. She checks her BP regularly and most readings 120-130's/70's. A few SBP's 110's and 140's.  Negative for unusual or severe headache, visual changes, exertional chest pain, dyspnea,  focal weakness, or edema.  Lab Results  Component Value Date   CREATININE 0.84 04/18/2021   BUN 17 04/18/2021   NA 139 04/18/2021   K 3.7 04/18/2021   CL 103 04/18/2021   CO2 31 04/18/2021   Hyperlipidemia: + CVA with no residual deficit. She is on non pharmacologic treatment. Lab Results  Component Value Date   CHOL 206 (H) 04/18/2021   HDL 48.30 04/18/2021   LDLCALC 125 (H) 04/18/2021   TRIG 162.0 (H) 04/18/2021   CHOLHDL 4 04/18/2021   Anxiety:She is on Valium 5 mg , which she does not need frequently, she takes 1/4-1/2 tab at the time. She has tolerated medication well, no side effects. No depression like symptoms.  She enjoys cooking and she is active with chores around her house. Independent ADL's and IADL's.  Review of Systems  Constitutional:  Negative for activity change, appetite change and fever.  HENT:  Negative for mouth sores, nosebleeds and sore throat.   Respiratory:  Negative for cough and wheezing.   Gastrointestinal:  Negative for abdominal pain, nausea and vomiting.       Negative for changes in bowel habits.  Genitourinary:  Negative for decreased urine volume, dysuria and hematuria.  Neurological:  Negative for syncope, facial asymmetry and numbness.  Rest of ROS see pertinent positives and negatives in HPI.  Current Outpatient Medications on File Prior to Visit  Medication Sig  Dispense Refill   chlorthalidone (HYGROTON) 25 MG tablet Take 0.5 tablets (12.5 mg total) by mouth daily. 90 tablet 1   furosemide (LASIX) 20 MG tablet TAKE 0.5-1 TABLETS (10-20 MG TOTAL) BY MOUTH DAILY AS NEEDED. 90 tablet 1   No current facility-administered medications on file prior to visit.   Past Medical History:  Diagnosis Date   Anxiety    Hyperlipidemia    Hypertension    Stroke (HCC)    Allergies  Allergen Reactions   Erythromycin     IV   Social History   Socioeconomic History   Marital status: Married    Spouse name: Amada Jupiter   Number of children: 0   Years of education: 16   Highest education level: Bachelor's degree (e.g., BA, AB, BS)  Occupational History   Occupation: retired  Tobacco Use   Smoking status: Former    Types: Cigarettes    Quit date: 05/19/2017    Years since quitting: 4.4   Smokeless tobacco: Never  Substance and Sexual Activity   Alcohol use: No   Drug use: Yes    Types: Benzodiazepines   Sexual activity: Not on file  Other Topics Concern   Not on file  Social History Narrative   Patient is right-handed. She lives with her husband in a one story house. She avoids caffeine. She does not exercise. She is a retired Programmer, systems.   Social Determinants of Health   Financial Resource Strain: Low Risk  (03/08/2021)  Overall Financial Resource Strain (CARDIA)    Difficulty of Paying Living Expenses: Not hard at all  Food Insecurity: No Food Insecurity (03/08/2021)   Hunger Vital Sign    Worried About Running Out of Food in the Last Year: Never true    Ran Out of Food in the Last Year: Never true  Transportation Needs: No Transportation Needs (03/08/2021)   PRAPARE - Administrator, Civil Service (Medical): No    Lack of Transportation (Non-Medical): No  Physical Activity: Inactive (03/08/2021)   Exercise Vital Sign    Days of Exercise per Week: 0 days    Minutes of Exercise per Session: 0 min  Stress: No Stress Concern Present  (03/08/2021)   [employer] of Occupational Health - Occupational Stress Questionnaire    Feeling of Stress : Not at all  Social Connections: Socially Integrated (03/08/2021)   Social Connection and Isolation Panel [NHANES]    Frequency of Communication with Friends and Family: More than three times a week    Frequency of Social Gatherings with Friends and Family: More than three times a week    Attends Religious Services: More than 4 times per year    Active Member of [employer] or Organizations: Yes    Attends Banker Meetings: More than 4 times per year    Marital Status: Married   Vitals:   10/23/21 1220  BP: 128/80  Pulse: 83  Resp: 12  Temp: 98.5 F (36.9 C)  SpO2: 97%   Wt Readings from Last 3 Encounters:  10/23/21 167 lb 6 oz (75.9 kg)  04/18/21 178 lb 4 oz (80.9 kg)  03/08/21 177 lb (80.3 kg)  Body mass index is 30.61 kg/m.  Physical Exam Vitals and nursing note reviewed.  Constitutional:      General: She is not in acute distress.    Appearance: She is well-developed.  HENT:     Head: Normocephalic and atraumatic.  Eyes:     Conjunctiva/sclera: Conjunctivae normal.  Cardiovascular:     Rate and Rhythm: Normal rate and regular rhythm.     Pulses:          Dorsalis pedis pulses are 2+ on the right side and 2+ on the left side.     Heart sounds: No murmur heard. Pulmonary:     Effort: Pulmonary effort is normal. No respiratory distress.     Breath sounds: Normal breath sounds.  Abdominal:     Palpations: Abdomen is soft. There is no hepatomegaly or mass.     Tenderness: There is no abdominal tenderness.  Musculoskeletal:     Right lower leg: No edema.     Left lower leg: No edema.  Skin:    General: Skin is warm.     Findings: No erythema or rash.  Neurological:     General: No focal deficit present.     Mental Status: She is alert and oriented to person, place, and time.     Cranial Nerves: No cranial nerve deficit.     Gait: Gait normal.   Psychiatric:        Mood and Affect: Mood and affect normal.   ASSESSMENT AND PLAN:  Danielle Trevino was seen today for follow-up.  Diagnoses and all orders for this visit: Orders Placed This Encounter  Procedures   Comprehensive metabolic panel   Lipid panel   TSH   Hyperlipidemia, unspecified hyperlipidemia type LDL is not at goal. She has not been interested in taking statin medication.  We have discussed CV benefits of statins. She would like labs done in 12/2021.  Hypertension, essential, benign BP has greatly improved. She is currently on Chlorthalidone 25 mg 1/2 tab 3 times per week and planning on continuing weaning medication off. Continue low salt diet. Continue monitoring BP regularly.  Anxiety disorder, unspecified type Problem has improved. Continue same dose of Valium. She does not need medication very often, so I think it is appropriate to continue annual follow ups, sooner if problem gets worse.  -     diazepam (VALIUM) 5 MG tablet; Take 0.5-1 tablets (2.5-5 mg total) by mouth every 12 (twelve) hours as needed for anxiety.  Return in about 1 year (around 10/24/2022) for CPE and f/u. Labs in 12/2021.Marland Kitchen  Ontario Pettengill G. [place], MD  [facility]. [place] office.

## 2021-10-23 NOTE — Patient Instructions (Addendum)
A few things to remember from today's visit:   Hyperlipidemia, unspecified hyperlipidemia type - Plan: Comprehensive metabolic panel, Lipid panel  Hypertension, essential, benign - Plan: TSH  If you need refills please call your pharmacy. Do not use My Chart to request refills or for acute issues that need immediate attention.   No changes today.   Please be sure medication list is accurate. If a new problem present, please set up appointment sooner than planned today.

## 2021-11-11 ENCOUNTER — Other Ambulatory Visit: Payer: Self-pay | Admitting: Family Medicine

## 2021-11-11 DIAGNOSIS — I10 Essential (primary) hypertension: Secondary | ICD-10-CM

## 2021-12-24 ENCOUNTER — Other Ambulatory Visit (INDEPENDENT_AMBULATORY_CARE_PROVIDER_SITE_OTHER): Payer: Medicare PPO

## 2021-12-24 DIAGNOSIS — I10 Essential (primary) hypertension: Secondary | ICD-10-CM | POA: Diagnosis not present

## 2021-12-24 DIAGNOSIS — E785 Hyperlipidemia, unspecified: Secondary | ICD-10-CM

## 2021-12-24 LAB — COMPREHENSIVE METABOLIC PANEL
ALT: 21 U/L (ref 0–35)
AST: 23 U/L (ref 0–37)
Albumin: 4.1 g/dL (ref 3.5–5.2)
Alkaline Phosphatase: 58 U/L (ref 39–117)
BUN: 19 mg/dL (ref 6–23)
CO2: 28 mEq/L (ref 19–32)
Calcium: 9.7 mg/dL (ref 8.4–10.5)
Chloride: 101 mEq/L (ref 96–112)
Creatinine, Ser: 0.74 mg/dL (ref 0.40–1.20)
GFR: 78.13 mL/min (ref >60.00)
Glucose, Bld: 81 mg/dL (ref 70–99)
Potassium: 3.8 mEq/L (ref 3.5–5.1)
Sodium: 141 mEq/L (ref 135–145)
Total Bilirubin: 0.7 mg/dL (ref 0.2–1.2)
Total Protein: 6.6 g/dL (ref 6.0–8.3)

## 2021-12-24 LAB — LIPID PANEL
Cholesterol: 267 mg/dL — ABNORMAL HIGH (ref 0–200)
HDL: 58.3 mg/dL (ref >39.00)
LDL Cholesterol: 185 mg/dL — ABNORMAL HIGH (ref 0–99)
NonHDL: 209.1
Total CHOL/HDL Ratio: 5
Triglycerides: 123 mg/dL (ref 0.0–149.0)
VLDL: 24.6 mg/dL (ref 0.0–40.0)

## 2021-12-24 LAB — TSH: TSH: 1.34 u[IU]/mL (ref 0.35–5.50)

## 2022-03-22 ENCOUNTER — Ambulatory Visit (INDEPENDENT_AMBULATORY_CARE_PROVIDER_SITE_OTHER): Payer: Medicare PPO

## 2022-03-22 VITALS — Ht 62.0 in | Wt 163.0 lb

## 2022-03-22 DIAGNOSIS — Z0000 Encounter for general adult medical examination without abnormal findings: Secondary | ICD-10-CM

## 2022-03-22 NOTE — Progress Notes (Signed)
Subjective:   Danielle Trevino is a 77 y.o. female who presents for Medicare Annual (Subsequent) preventive examination.  Review of Systems    Virtual Visit via Telephone Note  I connected with  Danielle Trevino on 03/22/22 at  2:00 PM EST by telephone and verified that I am speaking with the correct person using two identifiers.  Location: Patient: Home Provider: Office Persons participating in the virtual visit: patient/Nurse Health Advisor   I discussed the limitations, risks, security and privacy concerns of performing an evaluation and management service by telephone and the availability of in person appointments. The patient expressed understanding and agreed to proceed.  Interactive audio and video telecommunications were attempted between this nurse and patient, however failed, due to patient having technical difficulties OR patient did not have access to video capability.  We continued and completed visit with audio only.  Some vital signs may be absent or patient reported.   Criselda Peaches, LPN  Cardiac Risk Factors include: advanced age (>19men, >64 women);hypertension     Objective:    Today's Vitals   03/22/22 1407  Weight: 163 lb (73.9 kg)  Height: 5\' 2"  (1.575 m)   Body mass index is 29.81 kg/m.     03/22/2022    2:14 PM 03/08/2021    3:32 PM 03/17/2020    1:19 PM 03/17/2019    1:27 PM  Advanced Directives  Does Patient Have a Medical Advance Directive? Yes Yes Yes Yes  Type of Paramedic of [place];Living will [place];Living will [place];Living will [place];Living will  Does patient want to make changes to medical advance directive?  No - Patient declined No - Patient declined No - Patient declined  Copy of [place] in Chart? No - copy requested No - copy requested No - copy requested No - copy requested    Current Medications (verified) Outpatient  Encounter Medications as of 03/22/2022  Medication Sig   chlorthalidone (HYGROTON) 25 MG tablet TAKE 1 TABLET (25 MG TOTAL) BY MOUTH DAILY.   diazepam (VALIUM) 5 MG tablet Take 0.5-1 tablets (2.5-5 mg total) by mouth every 12 (twelve) hours as needed for anxiety.   furosemide (LASIX) 20 MG tablet TAKE 0.5-1 TABLETS (10-20 MG TOTAL) BY MOUTH DAILY AS NEEDED.   No facility-administered encounter medications on file as of 03/22/2022.    Allergies (verified) Erythromycin   History: Past Medical History:  Diagnosis Date   Anxiety    Hyperlipidemia    Hypertension    Stroke [facility])    Past Surgical History:  Procedure Laterality Date   APPENDECTOMY     EYE SURGERY     TONSILLECTOMY     Family History  Problem Relation Age of Onset   Arthritis Mother    Heart disease Mother    Arthritis Father    Heart disease Father    Diabetes Maternal Grandfather    Social History   Socioeconomic History   Marital status: Married    Spouse name: Quita Skye   Number of children: 0   Years of education: 16   Highest education level: Bachelor's degree (e.g., BA, AB, BS)  Occupational History   Occupation: retired  Tobacco Use   Smoking status: Former    Types: Cigarettes    Quit date: 05/19/2017    Years since quitting: 4.8   Smokeless tobacco: Never  Substance and Sexual Activity   Alcohol use: No   Drug  use: Yes    Types: Benzodiazepines   Sexual activity: Not on file  Other Topics Concern   Not on file  Social History Narrative   Patient is right-handed. She lives with her husband in a one story house. She avoids caffeine. She does not exercise. She is a retired Programmer, systems.   Social Determinants of Health   Financial Resource Strain: Low Risk  (03/22/2022)   Overall Financial Resource Strain (CARDIA)    Difficulty of Paying Living Expenses: Not hard at all  Food Insecurity: No Food Insecurity (03/22/2022)   Hunger Vital Sign    Worried About Running Out of Food in the Last Year: Never true     Ran Out of Food in the Last Year: Never true  Transportation Needs: No Transportation Needs (03/22/2022)   PRAPARE - Administrator, Civil Service (Medical): No    Lack of Transportation (Non-Medical): No  Physical Activity: Inactive (03/22/2022)   Exercise Vital Sign    Days of Exercise per Week: 0 days    Minutes of Exercise per Session: 0 min  Stress: No Stress Concern Present (03/22/2022)   [employer] of Occupational Health - Occupational Stress Questionnaire    Feeling of Stress : Not at all  Social Connections: Socially Integrated (03/22/2022)   Social Connection and Isolation Panel [NHANES]    Frequency of Communication with Friends and Family: More than three times a week    Frequency of Social Gatherings with Friends and Family: More than three times a week    Attends Religious Services: More than 4 times per year    Active Member of [employer] or Organizations: Yes    Attends Engineer, structural: More than 4 times per year    Marital Status: Married    Tobacco Counseling Counseling given: Not Answered   Clinical Intake:  Pre-visit preparation completed: No  Pain : No/denies pain     BMI - recorded: 29.81 Nutritional Status: BMI 25 -29 Overweight Nutritional Risks: None Diabetes: No  How often do you need to have someone help you when you read instructions, pamphlets, or other written materials from your doctor or pharmacy?: 1 - Never  Diabetic?  No  Interpreter Needed?: No  Information entered by :: Theresa Mulligan LPN   Activities of Daily Living    03/22/2022    2:12 PM  In your present state of health, do you have any difficulty performing the following activities:  Hearing? 1  Comment Wears hearing aids  Vision? 0  Difficulty concentrating or making decisions? 0  Walking or climbing stairs? 0  Dressing or bathing? 0  Doing errands, shopping? 0  Preparing Food and eating ? N  Using the Toilet? N  In the past six months, have  you accidently leaked urine? N  Do you have problems with loss of bowel control? N  Managing your Medications? N  Managing your Finances? N  Housekeeping or managing your Housekeeping? N    Patient Care Team: [place], Betty G, MD as PCP - General (Family Medicine)  Indicate any recent Medical Services you may have received from other than [facility] providers in the past year (date may be approximate).     Assessment:   This is a routine wellness examination for [place].  Hearing/Vision screen Hearing Screening - Comments:: Wears hearing aids Vision Screening - Comments:: Wears reading glasses - up to date with routine eye exams with Dr Martha Clan    Dietary issues and exercise activities discussed: Exercise  limited by: None identified   Goals Addressed               This Visit's Progress     patient stated (pt-stated)        Get You Tube up and running.       Depression Screen    03/22/2022    2:11 PM 10/23/2021   12:28 PM 04/18/2021    9:58 AM 03/08/2021    3:25 PM 02/06/2021    7:35 PM 03/17/2020    1:21 PM 03/17/2019    1:32 PM  PHQ 2/9 Scores  PHQ - 2 Score 0 0 0 0 0 0 0  PHQ- 9 Score   0        Fall Risk    03/22/2022    2:12 PM 10/23/2021   12:28 PM 03/08/2021    3:28 PM 02/06/2021    7:35 PM 03/17/2020    1:21 PM  [place] in the past year? 0 0 0 0 0  Number falls in past yr: 0 0 0 0 0  Injury with Fall? 0 0 0 0 0  Risk for fall due to : No Fall Risks No Fall Risks   No Fall Risks  Follow up Falls prevention discussed Falls evaluation completed  Education provided Falls evaluation completed;Falls prevention discussed    FALL RISK PREVENTION PERTAINING TO THE HOME:  Any stairs in or around the home? Yes  If so, are there any without handrails? No  Home free of loose throw rugs in walkways, pet beds, electrical cords, etc? Yes  Adequate lighting in your home to reduce risk of falls? Yes   ASSISTIVE DEVICES UTILIZED TO PREVENT FALLS:  Life alert? No  Use  of a cane, walker or w/c? No  Grab bars in the bathroom? Yes  Shower chair or bench in shower? Yes  Elevated toilet seat or a handicapped toilet? Yes   TIMED UP AND GO:  Was the test performed? No .    Cognitive Function:        03/22/2022    2:14 PM 03/08/2021    3:29 PM 03/17/2019    1:46 PM  6CIT Screen  What Year? 0 points 0 points 0 points  What month? 0 points 0 points 0 points  What time? 0 points 0 points 0 points  Count back from 20 0 points 0 points 0 points  Months in reverse 0 points 0 points 0 points  Repeat phrase 0 points 0 points 0 points  Total Score 0 points 0 points 0 points    Immunizations  There is no immunization history on file for this patient.            Screening Tests Health Maintenance  Topic Date Due   Medicare Annual Wellness (AWV)  03/23/2023   HPV VACCINES  Aged Out   DTaP/Tdap/Td  Discontinued   Pneumonia Vaccine 69+ Years old  Discontinued   INFLUENZA VACCINE  Discontinued   DEXA SCAN  Discontinued   COVID-19 Vaccine  Discontinued   Hepatitis C Screening  Discontinued   Zoster Vaccines- Shingrix  Discontinued    Health Maintenance  There are no preventive care reminders to display for this patient.   Colorectal cancer screening: No longer required.   Mammogram status: No longer required due to Age.    Lung Cancer Screening: (Low Dose CT Chest recommended if Age 76-80 years, 30 pack-year currently smoking OR have quit w/in 15years.) does not  qualify.     Additional Screening:     Vision Screening: Recommended annual ophthalmology exams for early detection of glaucoma and other disorders of the eye. Is the patient up to date with their annual eye exam?  Yes  Who is the provider or what is the name of the office in which the patient attends annual eye exams? Dr Laban Emperor If pt is not established with a provider, would they like to be referred to a provider to establish care? No .   Dental Screening: Recommended  annual dental exams for proper oral hygiene  Community Resource Referral / Chronic Care Management:  CRR required this visit?  No   CCM required this visit?  No      Plan:     I have personally reviewed and noted the following in the patient's chart:   Medical and social history Use of alcohol, tobacco or illicit drugs  Current medications and supplements including opioid prescriptions. Patient is not currently taking opioid prescriptions. Functional ability and status Nutritional status Physical activity Advanced directives List of other physicians Hospitalizations, surgeries, and ER visits in previous 12 months Vitals Screenings to include cognitive, depression, and falls Referrals and appointments  In addition, I have reviewed and discussed with patient certain preventive protocols, quality metrics, and best practice recommendations. A written personalized care plan for preventive services as well as general preventive health recommendations were provided to patient.     Criselda Peaches, LPN   [id number]/8117   Nurse Notes: None

## 2022-03-22 NOTE — Patient Instructions (Addendum)
Danielle Trevino , Thank you for taking time to come for your Medicare Wellness Visit. I appreciate your ongoing commitment to your health goals. Please review the following plan we discussed and let me know if I can assist you in the future.   These are the goals we discussed:  Goals       Exercise 150 min/wk Moderate Activity      patient stated (pt-stated)      Get You Tube up and running.      stop having visual aura's        This is a list of the screening recommended for you and due dates:  Health Maintenance  Topic Date Due   Medicare Annual Wellness Visit  03/23/2023   HPV Vaccine  Aged Out   DTaP/Tdap/Td vaccine  Discontinued   Pneumonia Vaccine  Discontinued   Flu Shot  Discontinued   DEXA scan (bone density measurement)  Discontinued   COVID-19 Vaccine  Discontinued   Hepatitis C Screening: USPSTF Recommendation to screen - Ages 3-79 yo.  Discontinued   Zoster (Shingles) Vaccine  Discontinued    Advanced directives: Please bring a copy of your health care power of attorney and living will to the office to be added to your chart at your convenience.   Conditions/risks identified: None  Next appointment: Follow up in one year for your annual wellness visit     Preventive Care 65 Years and Older, Female Preventive care refers to lifestyle choices and visits with your health care provider that can promote health and wellness. What does preventive care include? A yearly physical exam. This is also called an annual well check. Dental exams once or twice a year. Routine eye exams. Ask your health care provider how often you should have your eyes checked. Personal lifestyle choices, including: Daily care of your teeth and gums. Regular physical activity. Eating a healthy diet. Avoiding tobacco and drug use. Limiting alcohol use. Practicing safe sex. Taking low-dose aspirin every day. Taking vitamin and mineral supplements as recommended by your health care  provider. What happens during an annual well check? The services and screenings done by your health care provider during your annual well check will depend on your age, overall health, lifestyle risk factors, and family history of disease. Counseling  Your health care provider may ask you questions about your: Alcohol use. Tobacco use. Drug use. Emotional well-being. Home and relationship well-being. Sexual activity. Eating habits. History of falls. Memory and ability to understand (cognition). Work and work Statistician. Reproductive health. Screening  You may have the following tests or measurements: Height, weight, and BMI. Blood pressure. Lipid and cholesterol levels. These may be checked every 5 years, or more frequently if you are over 28 years old. Skin check. Lung cancer screening. You may have this screening every year starting at age 94 if you have a 30-pack-year history of smoking and currently smoke or have quit within the past 15 years. Fecal occult blood test (FOBT) of the stool. You may have this test every year starting at age 18. Flexible sigmoidoscopy or colonoscopy. You may have a sigmoidoscopy every 5 years or a colonoscopy every 10 years starting at age 48. Hepatitis C blood test. Hepatitis B blood test. Sexually transmitted disease (STD) testing. Diabetes screening. This is done by checking your blood sugar (glucose) after you have not eaten for a while (fasting). You may have this done every 1-3 years. Bone density scan. This is done to screen for osteoporosis. You  may have this done starting at age 25. Mammogram. This may be done every 1-2 years. Talk to your health care provider about how often you should have regular mammograms. Talk with your health care provider about your test results, treatment options, and if necessary, the need for more tests. Vaccines  Your health care provider may recommend certain vaccines, such as: Influenza vaccine. This is  recommended every year. Tetanus, diphtheria, and acellular pertussis (Tdap, Td) vaccine. You may need a Td booster every 10 years. Zoster vaccine. You may need this after age 67. Pneumococcal 13-valent conjugate (PCV13) vaccine. One dose is recommended after age 37. Pneumococcal polysaccharide (PPSV23) vaccine. One dose is recommended after age 1. Talk to your health care provider about which screenings and vaccines you need and how often you need them. This information is not intended to replace advice given to you by your health care provider. Make sure you discuss any questions you have with your health care provider. Document Released: 03/03/2015 Document Revised: 10/25/2015 Document Reviewed: 12/06/2014 Elsevier Interactive Patient Education  2017 [place] Prevention in the Home Falls can cause injuries. They can happen to people of all ages. There are many things you can do to make your home safe and to help prevent falls. What can I do on the outside of my home? Regularly fix the edges of walkways and driveways and fix any cracks. Remove anything that might make you trip as you walk through a door, such as a raised step or threshold. Trim any bushes or trees on the path to your home. Use bright outdoor lighting. Clear any walking paths of anything that might make someone trip, such as rocks or tools. Regularly check to see if handrails are loose or broken. Make sure that both sides of any steps have handrails. Any raised decks and porches should have guardrails on the edges. Have any leaves, snow, or ice cleared regularly. Use sand or salt on walking paths during winter. Clean up any spills in your garage right away. This includes oil or grease spills. What can I do in the bathroom? Use night lights. Install grab bars by the toilet and in the tub and shower. Do not use towel bars as grab bars. Use non-skid mats or decals in the tub or shower. If you need to sit down in  the shower, use a plastic, non-slip stool. Keep the floor dry. Clean up any water that spills on the floor as soon as it happens. Remove soap buildup in the tub or shower regularly. Attach bath mats securely with double-sided non-slip rug tape. Do not have throw rugs and other things on the floor that can make you trip. What can I do in the bedroom? Use night lights. Make sure that you have a light by your bed that is easy to reach. Do not use any sheets or blankets that are too big for your bed. They should not hang down onto the floor. Have a firm chair that has side arms. You can use this for support while you get dressed. Do not have throw rugs and other things on the floor that can make you trip. What can I do in the kitchen? Clean up any spills right away. Avoid walking on wet floors. Keep items that you use a lot in easy-to-reach places. If you need to reach something above you, use a strong step stool that has a grab bar. Keep electrical cords out of the way. Do not use floor  polish or wax that makes floors slippery. If you must use wax, use non-skid floor wax. Do not have throw rugs and other things on the floor that can make you trip. What can I do with my stairs? Do not leave any items on the stairs. Make sure that there are handrails on both sides of the stairs and use them. Fix handrails that are broken or loose. Make sure that handrails are as long as the stairways. Check any carpeting to make sure that it is firmly attached to the stairs. Fix any carpet that is loose or worn. Avoid having throw rugs at the top or bottom of the stairs. If you do have throw rugs, attach them to the floor with carpet tape. Make sure that you have a light switch at the top of the stairs and the bottom of the stairs. If you do not have them, ask someone to add them for you. What else can I do to help prevent falls? Wear shoes that: Do not have high heels. Have rubber bottoms. Are comfortable  and fit you well. Are closed at the toe. Do not wear sandals. If you use a stepladder: Make sure that it is fully opened. Do not climb a closed stepladder. Make sure that both sides of the stepladder are locked into place. Ask someone to hold it for you, if possible. Clearly mark and make sure that you can see: Any grab bars or handrails. First and last steps. Where the edge of each step is. Use tools that help you move around (mobility aids) if they are needed. These include: Canes. Walkers. Scooters. Crutches. Turn on the lights when you go into a dark area. Replace any light bulbs as soon as they burn out. Set up your furniture so you have a clear path. Avoid moving your furniture around. If any of your floors are uneven, fix them. If there are any pets around you, be aware of where they are. Review your medicines with your doctor. Some medicines can make you feel dizzy. This can increase your chance of falling. Ask your doctor what other things that you can do to help prevent falls. This information is not intended to replace advice given to you by your health care provider. Make sure you discuss any questions you have with your health care provider. Document Released: 12/01/2008 Document Revised: 07/13/2015 Document Reviewed: 03/11/2014 Elsevier Interactive Patient Education  2017 [employer].

## 2022-07-02 DIAGNOSIS — H43813 Vitreous degeneration, bilateral: Secondary | ICD-10-CM | POA: Diagnosis not present

## 2022-07-02 DIAGNOSIS — H16223 Keratoconjunctivitis sicca, not specified as Sjogren's, bilateral: Secondary | ICD-10-CM | POA: Diagnosis not present

## 2022-10-03 ENCOUNTER — Encounter (INDEPENDENT_AMBULATORY_CARE_PROVIDER_SITE_OTHER): Payer: Self-pay

## 2022-10-23 NOTE — Progress Notes (Unsigned)
HPI: Danielle Trevino is a 77 y.o. female, who is here today with her husband for her routine physical.  Last CPE: 04/18/21.  ***   Chronic medical problems: CVA,HTN,HLD,anxiety,LE vein disease,and SK among some.   There is no immunization history on file for this patient. Health Maintenance  Topic Date Due   Medicare Annual Wellness (AWV)  03/23/2023   HPV VACCINES  Aged Out   DTaP/Tdap/Td  Discontinued   Pneumonia Vaccine 63+ Years old  Discontinued   INFLUENZA VACCINE  Discontinued   DEXA SCAN  Discontinued   COVID-19 Vaccine  Discontinued   Hepatitis C Screening  Discontinued   Zoster Vaccines- Shingrix  Discontinued   She has no concerns today. Hyperlipidemia: Currently she is on nonpharmacologic treatment.  Lab Results  Component Value Date   CHOL 267 (H) 12/24/2021   HDL 58.30 12/24/2021   LDLCALC 185 (H) 12/24/2021   TRIG 123.0 12/24/2021   CHOLHDL 5 12/24/2021   Hypertension: She is on chlorthalidone 25 mg 1/2 tablet daily. ***  Lab Results  Component Value Date   NA 141 12/24/2021   CL 101 12/24/2021   K 3.8 12/24/2021   CO2 28 12/24/2021   BUN 19 12/24/2021   CREATININE 0.74 12/24/2021   GFR 78.13 12/24/2021   CALCIUM 9.7 12/24/2021   ALBUMIN 4.1 12/24/2021   GLUCOSE 81 12/24/2021    Anxiety: Currently she is on Valium 5 mg daily as needed, she does not take medication very often.   Review of Systems  Constitutional:  Negative for appetite change, fatigue and fever.  HENT:  Negative for hearing loss, mouth sores and sore throat.   Eyes:  Negative for redness and visual disturbance.  Respiratory:  Negative for cough, shortness of breath and wheezing.   Cardiovascular:  Negative for chest pain and leg swelling.  Gastrointestinal:  Negative for abdominal pain, nausea and vomiting.       No changes in bowel habits.  Endocrine: Negative for cold intolerance, heat intolerance, polydipsia, polyphagia and polyuria.  Genitourinary:  Negative for  decreased urine volume, dysuria, hematuria, vaginal bleeding and vaginal discharge.  Musculoskeletal:  Negative for gait problem and myalgias.  Skin:  Negative for color change and rash.  Allergic/Immunologic: Negative for environmental allergies.  Neurological:  Negative for syncope, weakness and headaches.  Hematological:  Negative for adenopathy. Does not bruise/bleed easily.  Psychiatric/Behavioral:  Negative for confusion and sleep disturbance.   All other systems reviewed and are negative.   No current outpatient medications on file prior to visit.   No current facility-administered medications on file prior to visit.   Past Medical History:  Diagnosis Date   Anxiety    Hyperlipidemia    Hypertension    Stroke [facility])    Past Surgical History:  Procedure Laterality Date   APPENDECTOMY     EYE SURGERY     TONSILLECTOMY      Allergies  Allergen Reactions   Erythromycin     IV    Family History  Problem Relation Age of Onset   Arthritis Mother    Heart disease Mother    Arthritis Father    Heart disease Father    Diabetes Maternal Grandfather     Social History   Socioeconomic History   Marital status: Married    Spouse name: Amada Jupiter   Number of children: 0   Years of education: 16   Highest education level: Bachelor's degree (e.g., BA, AB, BS)  Occupational History   Occupation:  retired  Tobacco Use   Smoking status: Former    Current packs/day: 0.00    Types: Cigarettes    Quit date: 05/19/2017    Years since quitting: 5.4   Smokeless tobacco: Never  Substance and Sexual Activity   Alcohol use: No   Drug use: Yes    Types: Benzodiazepines   Sexual activity: Not on file  Other Topics Concern   Not on file  Social History Narrative   Patient is right-handed. She lives with her husband in a one story house. She avoids caffeine. She does not exercise. She is a retired Programmer, systems.   Social Determinants of Health   Financial Resource Strain: Low Risk   (03/22/2022)   Overall Financial Resource Strain (CARDIA)    Difficulty of Paying Living Expenses: Not hard at all  Food Insecurity: No Food Insecurity (03/22/2022)   Hunger Vital Sign    Worried About Running Out of Food in the Last Year: Never true    Ran Out of Food in the Last Year: Never true  Transportation Needs: No Transportation Needs (03/22/2022)   PRAPARE - Administrator, Civil Service (Medical): No    Lack of Transportation (Non-Medical): No  Physical Activity: Inactive (03/22/2022)   Exercise Vital Sign    Days of Exercise per Week: 0 days    Minutes of Exercise per Session: 0 min  Stress: No Stress Concern Present (03/22/2022)   [employer] of Occupational Health - Occupational Stress Questionnaire    Feeling of Stress : Not at all  Social Connections: Socially Integrated (03/22/2022)   Social Connection and Isolation Panel [NHANES]    Frequency of Communication with Friends and Family: More than three times a week    Frequency of Social Gatherings with Friends and Family: More than three times a week    Attends Religious Services: More than 4 times per year    Active Member of Clubs or Organizations: Yes    Attends Banker Meetings: More than 4 times per year    Marital Status: Married   Vitals:   10/25/22 1103 10/25/22 1108  BP: (!) 142/84 125/67  Pulse: 80   Resp: 16   Temp: 98.2 F (36.8 C)   SpO2: 97%    Body mass index is 29.75 kg/m.  Wt Readings from Last 3 Encounters:  10/25/22 164 lb 4 oz (74.5 kg)  03/22/22 163 lb (73.9 kg)  10/23/21 167 lb 6 oz (75.9 kg)   Physical Exam Vitals and nursing note reviewed.  Constitutional:      General: She is not in acute distress.    Appearance: She is well-developed.  HENT:     Head: Normocephalic and atraumatic.     Right Ear: Tympanic membrane, ear canal and external ear normal.     Left Ear: Tympanic membrane, ear canal and external ear normal.     Ears:     Comments: Hearing aid,  bilateral.    Mouth/Throat:     Mouth: Mucous membranes are moist.     Pharynx: Oropharynx is clear. Uvula midline.  Eyes:     Conjunctiva/sclera: Conjunctivae normal.     Pupils: Pupils are equal, round, and reactive to light.  Cardiovascular:     Rate and Rhythm: Normal rate and regular rhythm.     Pulses:          Dorsalis pedis pulses are 2+ on the right side and 2+ on the left side.     Heart  sounds: No murmur heard. Pulmonary:     Effort: Pulmonary effort is normal. No respiratory distress.     Breath sounds: Normal breath sounds.  Abdominal:     Palpations: Abdomen is soft. There is no hepatomegaly or mass.     Tenderness: There is no abdominal tenderness.  Musculoskeletal:     Comments: No signs of synovitis appreciated.  Lymphadenopathy:     Cervical: No cervical adenopathy.  Skin:    General: Skin is warm.     Findings: No erythema or rash.  Neurological:     General: No focal deficit present.     Mental Status: She is alert and oriented to person, place, and time.     Cranial Nerves: No cranial nerve deficit.     Coordination: Coordination normal.     Gait: Gait normal.     Deep Tendon Reflexes:     Reflex Scores:      Bicep reflexes are 2+ on the right side and 2+ on the left side.      Patellar reflexes are 2+ on the right side and 2+ on the left side. Psychiatric:        Speech: Speech normal.     Comments: Well groomed, good eye contact.    ASSESSMENT AND PLAN:  Danielle Trevino was here today annual physical examination.  Orders Placed This Encounter  Procedures   Comprehensive metabolic panel   TSH   Lipid panel   Lab Results  Component Value Date   CREATININE 0.84 04/18/2021   BUN 17 04/18/2021   NA 139 04/18/2021   K 3.7 04/18/2021   CL 103 04/18/2021   CO2 31 04/18/2021   Lab Results  Component Value Date   ALT 24 04/18/2021   AST 25 04/18/2021   ALKPHOS 68 04/18/2021   BILITOT 0.6 04/18/2021   Lab Results  Component Value Date    CHOL 206 (H) 04/18/2021   HDL 48.30 04/18/2021   LDLCALC 125 (H) 04/18/2021   TRIG 162.0 (H) 04/18/2021   CHOLHDL 4 04/18/2021   Routine general medical examination at a health care facility We discussed the importance of regular physical activity and healthy diet for prevention of chronic illness and/or complications. Preventive guidelines reviewed. Vaccination: Not interested in vaccination at this time. Ca++ and vit D supplementation to continue. Next CPE in a year.  Hypertension, essential, benign BP here mildly elevated but most readings at home are adequate. Continue chlorthalidone 25 mg 1/2 tablet daily. Low-salt diet recommended. Continue monitoring BP regularly. Eye exam is current. As far as problem is stable, we can continue annual follow-up.  Routine general medical examination at a health care facility We discussed the importance of regular physical activity and healthy diet for prevention of chronic illness and/or complications. Preventive guidelines reviewed. Vaccination: Declined vaccinations. She is not interested in further cancer screening, including breast and lung. Next CPE in a year.  Hyperlipidemia Last LDL was 185 in 12/2021, we discussed current goal recommendations. We discussed CV benefits of statins, she has not been interested in pharmacologic treatment. Recommend low fat diet. Further recommendation will be given according to lipid panel result.   Return in 1 year (on 10/25/2023) for CPE, Labs, chronic problems.  Petula Rotolo G. [place], MD  [facility]. [place] office.

## 2022-10-25 ENCOUNTER — Ambulatory Visit (INDEPENDENT_AMBULATORY_CARE_PROVIDER_SITE_OTHER): Payer: Medicare PPO | Admitting: Family Medicine

## 2022-10-25 ENCOUNTER — Encounter: Payer: Self-pay | Admitting: Family Medicine

## 2022-10-25 VITALS — BP 125/67 | HR 80 | Temp 98.2°F | Resp 16 | Ht 62.3 in | Wt 164.2 lb

## 2022-10-25 DIAGNOSIS — I10 Essential (primary) hypertension: Secondary | ICD-10-CM | POA: Diagnosis not present

## 2022-10-25 DIAGNOSIS — R600 Localized edema: Secondary | ICD-10-CM | POA: Diagnosis not present

## 2022-10-25 DIAGNOSIS — E785 Hyperlipidemia, unspecified: Secondary | ICD-10-CM | POA: Diagnosis not present

## 2022-10-25 DIAGNOSIS — E876 Hypokalemia: Secondary | ICD-10-CM

## 2022-10-25 DIAGNOSIS — Z0000 Encounter for general adult medical examination without abnormal findings: Secondary | ICD-10-CM | POA: Insufficient documentation

## 2022-10-25 DIAGNOSIS — F419 Anxiety disorder, unspecified: Secondary | ICD-10-CM | POA: Diagnosis not present

## 2022-10-25 LAB — COMPREHENSIVE METABOLIC PANEL
ALT: 19 U/L (ref 0–35)
AST: 21 U/L (ref 0–37)
Albumin: 4 g/dL (ref 3.5–5.2)
Alkaline Phosphatase: 65 U/L (ref 39–117)
BUN: 11 mg/dL (ref 6–23)
CO2: 33 meq/L — ABNORMAL HIGH (ref 19–32)
Calcium: 9.8 mg/dL (ref 8.4–10.5)
Chloride: 101 meq/L (ref 96–112)
Creatinine, Ser: 0.64 mg/dL (ref 0.40–1.20)
GFR: 84.84 mL/min (ref >60.00)
Glucose, Bld: 93 mg/dL (ref 70–99)
Potassium: 3.2 meq/L — ABNORMAL LOW (ref 3.5–5.1)
Sodium: 143 meq/L (ref 135–145)
Total Bilirubin: 0.7 mg/dL (ref 0.2–1.2)
Total Protein: 6.7 g/dL (ref 6.0–8.3)

## 2022-10-25 LAB — LIPID PANEL
Cholesterol: 229 mg/dL — ABNORMAL HIGH (ref 0–200)
HDL: 52.6 mg/dL (ref >39.00)
LDL Cholesterol: 145 mg/dL — ABNORMAL HIGH (ref 0–99)
NonHDL: 176.42
Total CHOL/HDL Ratio: 4
Triglycerides: 156 mg/dL — ABNORMAL HIGH (ref 0.0–149.0)
VLDL: 31.2 mg/dL (ref 0.0–40.0)

## 2022-10-25 LAB — TSH: TSH: 0.42 u[IU]/mL (ref 0.35–5.50)

## 2022-10-25 MED ORDER — CHLORTHALIDONE 25 MG PO TABS: 25.0000 mg | ORAL_TABLET | Freq: Every day | ORAL | 2 refills | Status: DC

## 2022-10-25 MED ORDER — FUROSEMIDE 20 MG PO TABS: 10.0000 mg | ORAL_TABLET | Freq: Every day | ORAL | 1 refills | Status: DC | PRN

## 2022-10-25 MED ORDER — DIAZEPAM 5 MG PO TABS
2.5000 mg | ORAL_TABLET | Freq: Two times a day (BID) | ORAL | 1 refills | Status: DC | PRN
Start: 2022-10-25 — End: 2023-10-27

## 2022-10-25 NOTE — Assessment & Plan Note (Signed)
BP here mildly elevated but most readings at home are adequate. Continue chlorthalidone 25 mg 1/2 tablet daily. Low-salt diet recommended. Continue monitoring BP regularly. Eye exam is current. As far as problem is stable, we can continue annual follow-up.

## 2022-10-25 NOTE — Assessment & Plan Note (Signed)
Last LDL was 185 in 12/2021, we discussed current goal recommendations. We discussed CV benefits of statins, she has not been interested in pharmacologic treatment. Recommend low fat diet. Further recommendation will be given according to lipid panel result.

## 2022-10-25 NOTE — Patient Instructions (Addendum)
A few things to remember from today's visit:  Routine general medical examination at a health care facility  Hypertension, essential, benign - Plan: Comprehensive metabolic panel, TSH, chlorthalidone (HYGROTON) 25 MG tablet  Hyperlipidemia, unspecified hyperlipidemia type - Plan: Comprehensive metabolic panel, Lipid panel  Anxiety disorder, unspecified type - Plan: diazepam (VALIUM) 5 MG tablet  Bilateral lower extremity edema - Plan: furosemide (LASIX) 20 MG tablet  If you need refills for medications you take chronically, please call your pharmacy. Do not use My Chart to request refills or for acute issues that need immediate attention. If you send a my chart message, it may take a few days to be addressed, specially if I am not in the office.  Please be sure medication list is accurate. If a new problem present, please set up appointment sooner than planned today.

## 2022-10-25 NOTE — Assessment & Plan Note (Signed)
We discussed the importance of regular physical activity and healthy diet for prevention of chronic illness and/or complications. Preventive guidelines reviewed. Vaccination: Declined vaccinations. She is not interested in further cancer screening, including breast and lung. Next CPE in a year.

## 2022-10-26 MED ORDER — POTASSIUM CHLORIDE CRYS ER 20 MEQ PO TBCR
20.0000 meq | EXTENDED_RELEASE_TABLET | Freq: Every day | ORAL | 0 refills | Status: DC
Start: 2022-10-26 — End: 2023-03-04

## 2022-10-26 NOTE — Assessment & Plan Note (Signed)
Most likely related to vein disease/insuffiencey. LE elevation as needed. Continue Furosemide 20 mg daily as needed.

## 2022-10-29 ENCOUNTER — Encounter: Payer: Self-pay | Admitting: Family Medicine

## 2023-02-27 ENCOUNTER — Encounter: Payer: Self-pay | Admitting: Family Medicine

## 2023-03-04 ENCOUNTER — Ambulatory Visit: Payer: Medicare PPO | Admitting: Family Medicine

## 2023-03-04 ENCOUNTER — Encounter: Payer: Self-pay | Admitting: Family Medicine

## 2023-03-04 VITALS — BP 128/80 | HR 85 | Temp 98.4°F | Resp 16 | Ht 62.3 in | Wt 167.1 lb

## 2023-03-04 DIAGNOSIS — E876 Hypokalemia: Secondary | ICD-10-CM | POA: Diagnosis not present

## 2023-03-04 DIAGNOSIS — L723 Sebaceous cyst: Secondary | ICD-10-CM | POA: Diagnosis not present

## 2023-03-04 DIAGNOSIS — L02212 Cutaneous abscess of back [any part, except buttock]: Secondary | ICD-10-CM

## 2023-03-04 DIAGNOSIS — L821 Other seborrheic keratosis: Secondary | ICD-10-CM | POA: Diagnosis not present

## 2023-03-04 LAB — BASIC METABOLIC PANEL
BUN: 21 mg/dL (ref 6–23)
CO2: 30 meq/L (ref 19–32)
Calcium: 10 mg/dL (ref 8.4–10.5)
Chloride: 99 meq/L (ref 96–112)
Creatinine, Ser: 0.74 mg/dL (ref 0.40–1.20)
GFR: 77.48 mL/min (ref >60.00)
Glucose, Bld: 94 mg/dL (ref 70–99)
Potassium: 3.6 meq/L (ref 3.5–5.1)
Sodium: 141 meq/L (ref 135–145)

## 2023-03-04 MED ORDER — SULFAMETHOXAZOLE-TRIMETHOPRIM 800-160 MG PO TABS
1.0000 | ORAL_TABLET | Freq: Two times a day (BID) | ORAL | 0 refills | Status: AC
Start: 2023-03-04 — End: 2023-03-11

## 2023-03-04 NOTE — Patient Instructions (Addendum)
 A few things to remember from today's visit:  Sebaceous cyst  Abscess of back  Seborrheic keratosis  Hypokalemia - Plan: Basic metabolic panel  Today I drained small abscess, infected sebaceous cyst. Most of the cyst is left, keep area dry and covered for 48 hours then keep it clean with soap and water. If bleeding, apply pressure for a few minutes. Bactrim  for 7 days.  If you need refills for medications you take chronically, please call your pharmacy. Do not use My Chart to request refills or for acute issues that need immediate attention. If you send a my chart message, it may take a few days to be addressed, specially if I am not in the office.  Please be sure medication list is accurate. If a new problem present, please set up appointment sooner than planned today.

## 2023-03-04 NOTE — Progress Notes (Addendum)
 ACUTE VISIT Chief Complaint  Patient presents with   Cyst    2 cysts on back    HPI: [name] is a 78 y.o. female with a PMHx significant for CVA, HTN, HLD, hearing loss, and anxiety, who is here today with her husband complaining of 2 weeks pf painful cyst on her back. She has hx of sebaceous cysts, she has a few in her back, has had some removed. Noted mild erythema, no drainage. She would like tender lesion to be I&D.  Negative for fever,chills,body aches,or changes in appetite. She has not tried OTC medication.  -She is also concerned about lesion under right eye, has been there for sometime, not sure about growth, it is not tender.  -Hyperpigmented lesion on left arm that her husband noted a few months ago, she is not sure for how long it has been present. Not tender,pruritic,not sure about growth.  Hypokalemia: Her potassium was 3.2 in 10/2022, has not been able to have it re-checked. HTN on Chlorthalidone  25 mg daily. Negative for chest pain, dyspnea, palpitation,or edema. Component     Latest Ref Rng 10/25/2022  Sodium     135 - 145 mEq/L 143   Potassium     3.5 - 5.1 mEq/L 3.2 (L)   Chloride     96 - 112 mEq/L 101   CO2     19 - 32 mEq/L 33 (H)   Glucose     70 - 99 mg/dL 93   BUN     6 - 23 mg/dL 11   Creatinine     9.59 - 1.20 mg/dL 9.35   Total Bilirubin     0.2 - 1.2 mg/dL 0.7   Alkaline Phosphatase     39 - 117 U/L 65   AST     0 - 37 U/L 21   ALT     0 - 35 U/L 19   Total Protein     6.0 - 8.3 g/dL 6.7   Albumin     3.5 - 5.2 g/dL 4.0   Calcium      8.4 - 10.5 mg/dL 9.8   GFR     >39.99 mL/min 84.84     Review of Systems  Constitutional:  Negative for activity change and fatigue.  HENT:  Negative for mouth sores and sore throat.   Respiratory:  Negative for cough, shortness of breath and wheezing.   Gastrointestinal:  Negative for abdominal pain and nausea.  Skin:  Negative for wound.  Neurological:  Negative for syncope and  weakness.  Hematological:  Negative for adenopathy. Does not bruise/bleed easily.  See other pertinent positives and negatives in HPI.  Current Outpatient Medications on File Prior to Visit  Medication Sig Dispense Refill   chlorthalidone  (HYGROTON ) 25 MG tablet Take 1 tablet (25 mg total) by mouth daily. 90 tablet 2   diazepam  (VALIUM ) 5 MG tablet Take 0.5-1 tablets (2.5-5 mg total) by mouth every 12 (twelve) hours as needed for anxiety. 20 tablet 1   furosemide  (LASIX ) 20 MG tablet Take 0.5-1 tablets (10-20 mg total) by mouth daily as needed. 90 tablet 1   No current facility-administered medications on file prior to visit.   Past Medical History:  Diagnosis Date   Anxiety    Hyperlipidemia    Hypertension    Stroke (HCC)    Allergies  Allergen Reactions   Erythromycin     IV    Social History   Socioeconomic History  Marital status: Married    Spouse name: Cheryl   Number of children: 0   Years of education: 16   Highest education level: Bachelor's degree (e.g., BA, AB, BS)  Occupational History   Occupation: retired  Tobacco Use   Smoking status: Former    Current packs/day: 0.00    Types: Cigarettes    Quit date: 05/19/2017    Years since quitting: 5.8   Smokeless tobacco: Never  Substance and Sexual Activity   Alcohol use: No   Drug use: Yes    Types: Benzodiazepines   Sexual activity: Not on file  Other Topics Concern   Not on file  Social History Narrative   Patient is right-handed. She lives with her husband in a one story house. She avoids caffeine. She does not exercise. She is a retired programmer, systems.   Social Drivers of Corporate Investment Banker Strain: Low Risk  (03/22/2022)   Overall Financial Resource Strain (CARDIA)    Difficulty of Paying Living Expenses: Not hard at all  Food Insecurity: No Food Insecurity (03/22/2022)   Hunger Vital Sign    Worried About Running Out of Food in the Last Year: Never true    Ran Out of Food in the Last Year: Never  true  Transportation Needs: No Transportation Needs (03/22/2022)   PRAPARE - Administrator, Civil Service (Medical): No    Lack of Transportation (Non-Medical): No  Physical Activity: Inactive (03/22/2022)   Exercise Vital Sign    Days of Exercise per Week: 0 days    Minutes of Exercise per Session: 0 min  Stress: No Stress Concern Present (03/22/2022)   [employer] of Occupational Health - Occupational Stress Questionnaire    Feeling of Stress : Not at all  Social Connections: Socially Integrated (03/22/2022)   Social Connection and Isolation Panel [NHANES]    Frequency of Communication with Friends and Family: More than three times a week    Frequency of Social Gatherings with Friends and Family: More than three times a week    Attends Religious Services: More than 4 times per year    Active Member of [employer] or Organizations: Yes    Attends Banker Meetings: More than 4 times per year    Marital Status: Married    Vitals:   03/04/23 1322  BP: 128/80  Pulse: 85  Resp: 16  Temp: 98.4 F (36.9 C)  SpO2: 96%   Body mass index is 30.27 kg/m.  Physical Exam Vitals and nursing note reviewed.  Constitutional:      General: She is not in acute distress.    Appearance: She is well-developed.  HENT:     Head: Normocephalic and atraumatic.  Eyes:     Conjunctiva/sclera: Conjunctivae normal.  Cardiovascular:     Rate and Rhythm: Normal rate.  Pulmonary:     Effort: Pulmonary effort is normal. No respiratory distress.  Lymphadenopathy:     Cervical: No cervical adenopathy.  Skin:    General: Skin is warm.     Findings: Lesion present. No rash.          Comments: -Scattered hyperpigmented, raised lesions on back, a few on face,and UE's. -Lesion under right eye is about 2-3 mm, mildly hyperpigmented and raised. Defined borders. -Lesion of concerned on left arm is about 1.2-1.5 cm, defined borders,hyperpigmented. Not tender.  Back: with a few nodular  lesions, one left mid back 1.5 in with erythema and fluctuant area, tender.  Other nodular  lesions with no erythema and not tender, 2-4 cm, defined borders.  Neurological:     Mental Status: She is alert and oriented to person, place, and time.  Psychiatric:        Mood and Affect: Mood and affect normal.    ASSESSMENT AND PLAN:  Ms. Hargrove was seen today for a cyst on her back.  Lab Results  Component Value Date   NA 141 03/04/2023   CL 99 03/04/2023   K 3.6 03/04/2023   CO2 30 03/04/2023   BUN 21 03/04/2023   CREATININE 0.74 03/04/2023   GFR 77.48 03/04/2023   CALCIUM  10.0 03/04/2023   ALBUMIN 4.0 10/25/2022   GLUCOSE 94 03/04/2023   Abscess of back Infected sebaceous cyst in her back. After explaining procedure and risk, she would like to have abscess drained. Area was prepare in sterile fashion, clean with iodine swab, local anesthesia with plain Lidocaine 1% 3 cc. About 1.2 linear incision was made, copious amount of yellowish material, about 1.5 ml, then sebum material seen. Sebaceous cyst was partially drained and part of capsule but not completely. Minimal blood loss. Compression bandage applied. Tolerated procedure well. Instructed to keep area dry and covered for 48 hours then shower as her normal. Bactrim  DS x 7 d. Instructed about warning signs. Wound check in 1 week, before if needed.  -     Sulfamethoxazole -Trimethoprim ; Take 1 tablet by mouth 2 (two) times daily for 7 days.  Dispense: 14 tablet; Refill: 0  Sebaceous cyst This problem has been recurrent for years. She does not want surgical consultation to have lesions removed.  Seborrheic keratosis We discussed Dx,prognosis,and treatment options. I do not appreciate suspicious lesions. She is not interested in dermatology consultation for now. Continue monitoring for changes.  Hypokalemia Mild. Continue K+ rich diet. For now no changes in Chlorthalidone  dose. She also takes Furosemide  daily as needed for  LE edema. Further recommendations according to lab result.  -     Basic metabolic panel; Future  Return in about 1 week (around 03/11/2023) for wound check.  Kaliya Shreiner G. Dula Havlik, MD  [facility]. [place] office.

## 2023-03-11 ENCOUNTER — Ambulatory Visit: Payer: Medicare PPO | Admitting: Family Medicine

## 2023-03-11 ENCOUNTER — Encounter: Payer: Self-pay | Admitting: Family Medicine

## 2023-03-11 VITALS — BP 120/76 | HR 94 | Ht 62.3 in

## 2023-03-11 DIAGNOSIS — L723 Sebaceous cyst: Secondary | ICD-10-CM

## 2023-03-11 NOTE — Patient Instructions (Addendum)
A few things to remember from today's visit:  Sebaceous cyst - Plan: Ambulatory referral to General Surgery  If you need refills for medications you take chronically, please call your pharmacy. Do not use My Chart to request refills or for acute issues that need immediate attention. If you send a my chart message, it may take a few days to be addressed, specially if I am not in the office.  Please be sure medication list is accurate. If a new problem present, please set up appointment sooner than planned today.

## 2023-03-11 NOTE — Progress Notes (Signed)
HPI: [name] is a 78 y.o. female with a PMHx significant for CVA, HTN, HLD, hearing loss, and anxiety, who is here today for to follow on her last visit for drainage of an abscess on her back. .  Last seen on 03/04/2023 Hx of sebaceous cyst in back with recurrent infections. Last visit and after was I&D, it was not back. It did continue draining until 2 to 3 days ago. Pain has greatly improved. Negative for fever, chills, changes in appetite, or unusual fatigue.  She is currently on Bactrim DS twice daily, she has 2 more tablets to take.  Initially it caused mild GI symptoms but in general it has been well-tolerated.  No other concerns today.  Review of Systems  Constitutional:  Negative for activity change and appetite change.  Gastrointestinal:  Negative for abdominal pain and vomiting.  Skin:  Negative for rash.  Neurological:  Negative for weakness and headaches.  See other pertinent positives and negatives in HPI.  Current Outpatient Medications on File Prior to Visit  Medication Sig Dispense Refill   chlorthalidone (HYGROTON) 25 MG tablet Take 1 tablet (25 mg total) by mouth daily. 90 tablet 2   diazepam (VALIUM) 5 MG tablet Take 0.5-1 tablets (2.5-5 mg total) by mouth every 12 (twelve) hours as needed for anxiety. 20 tablet 1   furosemide (LASIX) 20 MG tablet Take 0.5-1 tablets (10-20 mg total) by mouth daily as needed. 90 tablet 1   sulfamethoxazole-trimethoprim (BACTRIM DS) 800-160 MG tablet Take 1 tablet by mouth 2 (two) times daily for 7 days. 14 tablet 0   No current facility-administered medications on file prior to visit.   Past Medical History:  Diagnosis Date   Anxiety    Hyperlipidemia    Hypertension    Stroke (HCC)    Allergies  Allergen Reactions   Erythromycin     IV    Social History   Socioeconomic History   Marital status: Married    Spouse name: Danielle Trevino   Number of children: 0   Years of education: 16   Highest education level:  Bachelor's degree (e.g., BA, AB, BS)  Occupational History   Occupation: retired  Tobacco Use   Smoking status: Former    Current packs/day: 0.00    Types: Cigarettes    Quit date: 05/19/2017    Years since quitting: 5.8   Smokeless tobacco: Never  Substance and Sexual Activity   Alcohol use: No   Drug use: Yes    Types: Benzodiazepines   Sexual activity: Not on file  Other Topics Concern   Not on file  Social History Narrative   Patient is right-handed. She lives with her husband in a one story house. She avoids caffeine. She does not exercise. She is a retired Programmer, systems.   Social Drivers of Corporate investment banker Strain: Low Risk  (03/22/2022)   Overall Financial Resource Strain (CARDIA)    Difficulty of Paying Living Expenses: Not hard at all  Food Insecurity: No Food Insecurity (03/22/2022)   Hunger Vital Sign    Worried About Running Out of Food in the Last Year: Never true    Ran Out of Food in the Last Year: Never true  Transportation Needs: No Transportation Needs (03/22/2022)   PRAPARE - Administrator, Civil Service (Medical): No    Lack of Transportation (Non-Medical): No  Physical Activity: Inactive (03/22/2022)   Exercise Vital Sign    Days of Exercise per Week: 0  days    Minutes of Exercise per Session: 0 min  Stress: No Stress Concern Present (03/22/2022)   [employer] of Occupational Health - Occupational Stress Questionnaire    Feeling of Stress : Not at all  Social Connections: Socially Integrated (03/22/2022)   Social Connection and Isolation Panel [NHANES]    Frequency of Communication with Friends and Family: More than three times a week    Frequency of Social Gatherings with Friends and Family: More than three times a week    Attends Religious Services: More than 4 times per year    Active Member of [employer] or Organizations: Yes    Attends Banker Meetings: More than 4 times per year    Marital Status: Married   Vitals:    03/11/23 1251  BP: 120/76  Pulse: 94  SpO2: 96%   Body mass index is 30.27 kg/m.  Physical Exam Vitals and nursing note reviewed.  HENT:     Head: Normocephalic and atraumatic.  Skin:      Neurological:     Mental Status: She is alert and oriented to person, place, and time.  Psychiatric:        Mood and Affect: Mood and affect normal.    ASSESSMENT AND PLAN:  Ms. Flinders was seen today to follow on a visit for draining an abscess on her back.   Orders Placed This Encounter  Procedures   Ambulatory referral to General Surgery   Sebaceous cyst -     Ambulatory referral to General Surgery  Status post I&D of infected sebaceous cys in back, healing well, not longer draining. Continue keeping wound clean with soap and water. Monitor for signs of infection. Complete Bactrim DS treatment.  She has other sebaceous cyst in her back, currently not infected but she has had recurrent infections in the past.She agrees with general surgeon consultation to have it removed before infection happens.  Return if symptoms worsen or fail to improve, for keep next appointment.  I, Rolla Etienne Wierda, acting as a scribe for Opaline Reyburn [place], MD., have documented all relevant documentation on the behalf of Theadore Blunck [place], MD, as directed by  Ivar Domangue [place], MD while in the presence of Estha Few [place], MD.   I, Oviya Ammar [place], MD, have reviewed all documentation for this visit. The documentation on 03/11/23 for the exam, diagnosis, procedures, and orders are all accurate and complete.  Adryel Wortmann G. [place], MD  [facility].

## 2023-04-02 ENCOUNTER — Ambulatory Visit: Payer: Medicare PPO

## 2023-04-02 VITALS — Ht 62.5 in | Wt 167.0 lb

## 2023-04-02 DIAGNOSIS — Z0000 Encounter for general adult medical examination without abnormal findings: Secondary | ICD-10-CM

## 2023-04-02 NOTE — Patient Instructions (Addendum)
Danielle Trevino , Thank you for taking time to come for your Medicare Wellness Visit. I appreciate your ongoing commitment to your health goals. Please review the following plan we discussed and let me know if I can assist you in the future.   Referrals/Orders/Follow-Ups/Clinician Recommendations:   This is a list of the screening recommended for you and due dates:  Health Maintenance  Topic Date Due   Zoster (Shingles) Vaccine (1 of 2) Never done   Pneumonia Vaccine (1 of 1 - PCV) 10/26/2023*   Medicare Annual Wellness Visit  04/01/2024   HPV Vaccine  Aged Out   DTaP/Tdap/Td vaccine  Discontinued   Flu Shot  Discontinued   DEXA scan (bone density measurement)  Discontinued   COVID-19 Vaccine  Discontinued   Hepatitis C Screening  Discontinued  *Topic was postponed. The date shown is not the original due date.    Advanced directives: (Copy Requested) Please bring a copy of your health care power of attorney and living will to the office to be added to your chart at your convenience.  Next Medicare Annual Wellness Visit scheduled for next year: Yes

## 2023-04-02 NOTE — Progress Notes (Signed)
Subjective:   Danielle Trevino is a 78 y.o. female who presents for Medicare Annual (Subsequent) preventive examination.  Visit Complete: Virtual I connected with  Lindley Magnus on 04/02/23 by a audio enabled telemedicine application and verified that I am speaking with the correct person using two identifiers.  Patient Location: Home  Provider Location: Home Office  I discussed the limitations of evaluation and management by telemedicine. The patient expressed understanding and agreed to proceed.  Vital Signs: Because this visit was a virtual/telehealth visit, some criteria may be missing or patient reported. Any vitals not documented were not able to be obtained and vitals that have been documented are patient reported.      Objective:    Today's Vitals   04/02/23 1427  Weight: 167 lb (75.8 kg)  Height: 5' 2.5" (1.588 m)   Body mass index is 30.06 kg/m.     04/02/2023    2:33 PM 03/22/2022    2:14 PM 03/08/2021    3:32 PM 03/17/2020    1:19 PM 03/17/2019    1:27 PM  Advanced Directives  Does Patient Have a Medical Advance Directive? Yes Yes Yes Yes Yes  Type of Estate agent of [place];Living will Healthcare Power of [place];Living will Healthcare Power of [place];Living will Healthcare Power of [place];Living will Healthcare Power of [place];Living will  Does patient want to make changes to medical advance directive?   No - Patient declined No - Patient declined No - Patient declined  Copy of Healthcare Power of Attorney in Chart? No - copy requested No - copy requested No - copy requested No - copy requested No - copy requested    Current Medications (verified) Outpatient Encounter Medications as of 04/02/2023  Medication Sig   chlorthalidone (HYGROTON) 25 MG tablet Take 1 tablet (25 mg total) by mouth daily.   diazepam (VALIUM) 5 MG tablet Take 0.5-1 tablets (2.5-5 mg total) by mouth every 12 (twelve) hours as needed for anxiety.   furosemide  (LASIX) 20 MG tablet Take 0.5-1 tablets (10-20 mg total) by mouth daily as needed.   No facility-administered encounter medications on file as of 04/02/2023.    Allergies (verified) Erythromycin   History: Past Medical History:  Diagnosis Date   Anxiety    Hyperlipidemia    Hypertension    Stroke [facility])    Past Surgical History:  Procedure Laterality Date   APPENDECTOMY     EYE SURGERY     TONSILLECTOMY     Family History  Problem Relation Age of Onset   Arthritis Mother    Heart disease Mother    Arthritis Father    Heart disease Father    Diabetes Maternal Grandfather    Social History   Socioeconomic History   Marital status: Married    Spouse name: Amada Jupiter   Number of children: 0   Years of education: 16   Highest education level: Bachelor's degree (e.g., BA, AB, BS)  Occupational History   Occupation: retired  Tobacco Use   Smoking status: Former    Current packs/day: 0.00    Types: Cigarettes    Quit date: 05/19/2017    Years since quitting: 5.8   Smokeless tobacco: Never  Substance and Sexual Activity   Alcohol use: No   Drug use: Yes    Types: Benzodiazepines   Sexual activity: Not on file  Other Topics Concern   Not on file  Social History Narrative   Patient is right-handed. She lives with her  husband in a one story house. She avoids caffeine. She does not exercise. She is a retired Programmer, systems.   Social Drivers of Corporate investment banker Strain: Low Risk  (04/02/2023)   Overall Financial Resource Strain (CARDIA)    Difficulty of Paying Living Expenses: Not hard at all  Food Insecurity: No Food Insecurity (04/02/2023)   Hunger Vital Sign    Worried About Running Out of Food in the Last Year: Never true    Ran Out of Food in the Last Year: Never true  Transportation Needs: No Transportation Needs (04/02/2023)   PRAPARE - Administrator, Civil Service (Medical): No    Lack of Transportation (Non-Medical): No  Physical Activity: Inactive  (04/02/2023)   Exercise Vital Sign    Days of Exercise per Week: 0 days    Minutes of Exercise per Session: 0 min  Stress: No Stress Concern Present (04/02/2023)   [employer] of Occupational Health - Occupational Stress Questionnaire    Feeling of Stress : Not at all  Social Connections: Socially Integrated (04/02/2023)   Social Connection and Isolation Panel [NHANES]    Frequency of Communication with Friends and Family: More than three times a week    Frequency of Social Gatherings with Friends and Family: More than three times a week    Attends Religious Services: More than 4 times per year    Active Member of [employer] or Organizations: Yes    Attends Engineer, structural: More than 4 times per year    Marital Status: Married    Tobacco Counseling Counseling given: Not Answered   Clinical Intake:  Pre-visit preparation completed: Yes  Pain : No/denies pain     BMI - recorded: 30.06 Nutritional Status: BMI > 30  Obese Nutritional Risks: None Diabetes: No  How often do you need to have someone help you when you read instructions, pamphlets, or other written materials from your doctor or pharmacy?: 1 - Never  Interpreter Needed?: No  Information entered by :: Theresa Mulligan LPN   Activities of Daily Living    04/02/2023    2:32 PM  In your present state of health, do you have any difficulty performing the following activities:  Hearing? 1  Comment Wears Heaing Aids  Vision? 0  Difficulty concentrating or making decisions? 0  Walking or climbing stairs? 0  Dressing or bathing? 0  Doing errands, shopping? 0  Preparing Food and eating ? N  Using the Toilet? N  In the past six months, have you accidently leaked urine? N  Do you have problems with loss of bowel control? N  Managing your Medications? N  Managing your Finances? N  Housekeeping or managing your Housekeeping? N    Patient Care Team: [place], Betty G, MD as PCP - General (Family  Medicine)  Indicate any recent Medical Services you may have received from other than [facility] providers in the past year (date may be approximate).     Assessment:   This is a routine wellness examination for [place].  Hearing/Vision screen Hearing Screening - Comments:: Wears Hearing Aids Vision Screening - Comments:: Wears rx glasses - up to date with routine eye exams with  Dr Martha Clan   Goals Addressed               This Visit's Progress     Increase physical activity (pt-stated)         Depression Screen    04/02/2023    2:32 PM  03/04/2023    1:30 PM 10/25/2022   11:08 AM 03/22/2022    2:11 PM 10/23/2021   12:28 PM 04/18/2021    9:58 AM 03/08/2021    3:25 PM  PHQ 2/9 Scores  PHQ - 2 Score 0 0 0 0 0 0 0  PHQ- 9 Score   0   0     Fall Risk    04/02/2023    2:33 PM 10/25/2022   11:08 AM 03/22/2022    2:12 PM 10/23/2021   12:28 PM 03/08/2021    3:28 PM  Fall Risk   Falls in the past year? 0 0 0 0 0  Number falls in past yr: 0 0 0 0 0  Injury with Fall? 0 0 0 0 0  Risk for fall due to : No Fall Risks Other (Comment) No Fall Risks No Fall Risks   Follow up Falls prevention discussed;Falls evaluation completed Falls evaluation completed Falls prevention discussed Falls evaluation completed     MEDICARE RISK AT HOME: Medicare Risk at Home Any stairs in or around the home?: No If so, are there any without handrails?: No Home free of loose throw rugs in walkways, pet beds, electrical cords, etc?: Yes Adequate lighting in your home to reduce risk of falls?: Yes Life alert?: No Use of a cane, walker or w/c?: No Grab bars in the bathroom?: Yes Shower chair or bench in shower?: Yes Elevated toilet seat or a handicapped toilet?: Yes  TIMED UP AND GO:  Was the test performed?  No    Cognitive Function:        04/02/2023    2:33 PM 03/22/2022    2:14 PM 03/08/2021    3:29 PM 03/17/2019    1:46 PM  6CIT Screen  What Year? 0 points 0 points 0 points 0 points  What month? 0 points  0 points 0 points 0 points  What time? 0 points 0 points 0 points 0 points  Count back from 20 0 points 0 points 0 points 0 points  Months in reverse 0 points 0 points 0 points 0 points  Repeat phrase 0 points 0 points 0 points 0 points  Total Score 0 points 0 points 0 points 0 points    Immunizations  There is no immunization history on file for this patient.      Pneumococcal vaccine status: Declined,  Education has been provided regarding the importance of this vaccine but patient still declined. Advised may receive this vaccine at local pharmacy or Health Dept. Aware to provide a copy of the vaccination record if obtained from local pharmacy or Health Dept. Verbalized acceptance and understanding.     Qualifies for Shingles Vaccine? Yes   Zostavax completed No   Shingrix Completed?: No.    Education has been provided regarding the importance of this vaccine. Patient has been advised to call insurance company to determine out of pocket expense if they have not yet received this vaccine. Advised may also receive vaccine at local pharmacy or Health Dept. Verbalized acceptance and understanding.  Screening Tests Health Maintenance  Topic Date Due   Zoster Vaccines- Shingrix (1 of 2) Never done   Pneumonia Vaccine 18+ Years old (1 of 1 - PCV) 10/26/2023 (Originally 09/20/2009)   Medicare Annual Wellness (AWV)  04/01/2024   HPV VACCINES  Aged Out   DTaP/Tdap/Td  Discontinued   INFLUENZA VACCINE  Discontinued   DEXA SCAN  Discontinued   COVID-19 Vaccine  Discontinued   Hepatitis  C Screening  Discontinued    Health Maintenance  Health Maintenance Due  Topic Date Due   Zoster Vaccines- Shingrix (1 of 2) Never done    Additional Screening:   Vision Screening: Recommended annual ophthalmology exams for early detection of glaucoma and other disorders of the eye. Is the patient up to date with their annual eye exam?  Yes  Who is the provider or what is the name of the office in  which the patient attends annual eye exams? Dr Martha Clan If pt is not established with a provider, would they like to be referred to a provider to establish care? No .   Dental Screening: Recommended annual dental exams for proper oral hygiene    Community Resource Referral / Chronic Care Management:  CRR required this visit?  No   CCM required this visit?  No     Plan:     I have personally reviewed and noted the following in the patient's chart:   Medical and social history Use of alcohol, tobacco or illicit drugs  Current medications and supplements including opioid prescriptions. Patient is not currently taking opioid prescriptions. Functional ability and status Nutritional status Physical activity Advanced directives List of other physicians Hospitalizations, surgeries, and ER visits in previous 12 months Vitals Screenings to include cognitive, depression, and falls Referrals and appointments  In addition, I have reviewed and discussed with patient certain preventive protocols, quality metrics, and best practice recommendations. A written personalized care plan for preventive services as well as general preventive health recommendations were provided to patient.     Tillie Rung, LPN   [id number]   After Visit Summary: (MyChart) Due to this being a telephonic visit, the after visit summary with patients personalized plan was offered to patient via MyChart   Nurse Notes: None

## 2023-04-27 ENCOUNTER — Other Ambulatory Visit: Payer: Self-pay | Admitting: Family Medicine

## 2023-04-27 DIAGNOSIS — R600 Localized edema: Secondary | ICD-10-CM

## 2023-05-30 DIAGNOSIS — W010XXA Fall on same level from slipping, tripping and stumbling without subsequent striking against object, initial encounter: Secondary | ICD-10-CM | POA: Diagnosis not present

## 2023-05-30 DIAGNOSIS — I10 Essential (primary) hypertension: Secondary | ICD-10-CM | POA: Diagnosis not present

## 2023-05-30 DIAGNOSIS — Z87891 Personal history of nicotine dependence: Secondary | ICD-10-CM | POA: Diagnosis not present

## 2023-05-30 DIAGNOSIS — S42341A Displaced spiral fracture of shaft of humerus, right arm, initial encounter for closed fracture: Secondary | ICD-10-CM | POA: Diagnosis not present

## 2023-05-30 DIAGNOSIS — M25561 Pain in right knee: Secondary | ICD-10-CM | POA: Diagnosis not present

## 2023-06-02 DIAGNOSIS — S42481A Torus fracture of lower end of right humerus, initial encounter for closed fracture: Secondary | ICD-10-CM | POA: Diagnosis not present

## 2023-06-05 DIAGNOSIS — S42481A Torus fracture of lower end of right humerus, initial encounter for closed fracture: Secondary | ICD-10-CM | POA: Diagnosis not present

## 2023-06-05 DIAGNOSIS — S42421A Displaced comminuted supracondylar fracture without intercondylar fracture of right humerus, initial encounter for closed fracture: Secondary | ICD-10-CM | POA: Diagnosis not present

## 2023-06-05 DIAGNOSIS — S42411A Displaced simple supracondylar fracture without intercondylar fracture of right humerus, initial encounter for closed fracture: Secondary | ICD-10-CM | POA: Diagnosis not present

## 2023-06-05 DIAGNOSIS — G8918 Other acute postprocedural pain: Secondary | ICD-10-CM | POA: Diagnosis not present

## 2023-06-16 DIAGNOSIS — S42481A Torus fracture of lower end of right humerus, initial encounter for closed fracture: Secondary | ICD-10-CM | POA: Diagnosis not present

## 2023-06-23 DIAGNOSIS — M25521 Pain in right elbow: Secondary | ICD-10-CM | POA: Diagnosis not present

## 2023-06-23 DIAGNOSIS — G5631 Lesion of radial nerve, right upper limb: Secondary | ICD-10-CM | POA: Diagnosis not present

## 2023-06-23 DIAGNOSIS — R29898 Other symptoms and signs involving the musculoskeletal system: Secondary | ICD-10-CM | POA: Diagnosis not present

## 2023-06-23 DIAGNOSIS — M25621 Stiffness of right elbow, not elsewhere classified: Secondary | ICD-10-CM | POA: Diagnosis not present

## 2023-07-03 DIAGNOSIS — M25521 Pain in right elbow: Secondary | ICD-10-CM | POA: Diagnosis not present

## 2023-07-03 DIAGNOSIS — R29898 Other symptoms and signs involving the musculoskeletal system: Secondary | ICD-10-CM | POA: Diagnosis not present

## 2023-07-03 DIAGNOSIS — M25621 Stiffness of right elbow, not elsewhere classified: Secondary | ICD-10-CM | POA: Diagnosis not present

## 2023-07-03 DIAGNOSIS — G5631 Lesion of radial nerve, right upper limb: Secondary | ICD-10-CM | POA: Diagnosis not present

## 2023-07-07 DIAGNOSIS — M25521 Pain in right elbow: Secondary | ICD-10-CM | POA: Diagnosis not present

## 2023-07-07 DIAGNOSIS — R29898 Other symptoms and signs involving the musculoskeletal system: Secondary | ICD-10-CM | POA: Diagnosis not present

## 2023-07-07 DIAGNOSIS — M25621 Stiffness of right elbow, not elsewhere classified: Secondary | ICD-10-CM | POA: Diagnosis not present

## 2023-07-07 DIAGNOSIS — G5631 Lesion of radial nerve, right upper limb: Secondary | ICD-10-CM | POA: Diagnosis not present

## 2023-07-08 DIAGNOSIS — H16223 Keratoconjunctivitis sicca, not specified as Sjogren's, bilateral: Secondary | ICD-10-CM | POA: Diagnosis not present

## 2023-07-09 DIAGNOSIS — S42481D Torus fracture of lower end of right humerus, subsequent encounter for fracture with routine healing: Secondary | ICD-10-CM | POA: Diagnosis not present

## 2023-07-17 DIAGNOSIS — G5631 Lesion of radial nerve, right upper limb: Secondary | ICD-10-CM | POA: Diagnosis not present

## 2023-07-17 DIAGNOSIS — M25621 Stiffness of right elbow, not elsewhere classified: Secondary | ICD-10-CM | POA: Diagnosis not present

## 2023-07-17 DIAGNOSIS — M25521 Pain in right elbow: Secondary | ICD-10-CM | POA: Diagnosis not present

## 2023-07-17 DIAGNOSIS — R29898 Other symptoms and signs involving the musculoskeletal system: Secondary | ICD-10-CM | POA: Diagnosis not present

## 2023-07-24 DIAGNOSIS — M25521 Pain in right elbow: Secondary | ICD-10-CM | POA: Diagnosis not present

## 2023-07-24 DIAGNOSIS — G5631 Lesion of radial nerve, right upper limb: Secondary | ICD-10-CM | POA: Diagnosis not present

## 2023-07-24 DIAGNOSIS — M25621 Stiffness of right elbow, not elsewhere classified: Secondary | ICD-10-CM | POA: Diagnosis not present

## 2023-07-24 DIAGNOSIS — R29898 Other symptoms and signs involving the musculoskeletal system: Secondary | ICD-10-CM | POA: Diagnosis not present

## 2023-07-31 DIAGNOSIS — M25521 Pain in right elbow: Secondary | ICD-10-CM | POA: Diagnosis not present

## 2023-07-31 DIAGNOSIS — M25621 Stiffness of right elbow, not elsewhere classified: Secondary | ICD-10-CM | POA: Diagnosis not present

## 2023-07-31 DIAGNOSIS — R29898 Other symptoms and signs involving the musculoskeletal system: Secondary | ICD-10-CM | POA: Diagnosis not present

## 2023-07-31 DIAGNOSIS — G5631 Lesion of radial nerve, right upper limb: Secondary | ICD-10-CM | POA: Diagnosis not present

## 2023-08-04 DIAGNOSIS — G5631 Lesion of radial nerve, right upper limb: Secondary | ICD-10-CM | POA: Diagnosis not present

## 2023-08-07 DIAGNOSIS — R29898 Other symptoms and signs involving the musculoskeletal system: Secondary | ICD-10-CM | POA: Diagnosis not present

## 2023-08-07 DIAGNOSIS — M25521 Pain in right elbow: Secondary | ICD-10-CM | POA: Diagnosis not present

## 2023-08-07 DIAGNOSIS — M25621 Stiffness of right elbow, not elsewhere classified: Secondary | ICD-10-CM | POA: Diagnosis not present

## 2023-08-07 DIAGNOSIS — G5631 Lesion of radial nerve, right upper limb: Secondary | ICD-10-CM | POA: Diagnosis not present

## 2023-08-14 DIAGNOSIS — M25621 Stiffness of right elbow, not elsewhere classified: Secondary | ICD-10-CM | POA: Diagnosis not present

## 2023-08-14 DIAGNOSIS — G5631 Lesion of radial nerve, right upper limb: Secondary | ICD-10-CM | POA: Diagnosis not present

## 2023-08-14 DIAGNOSIS — R29898 Other symptoms and signs involving the musculoskeletal system: Secondary | ICD-10-CM | POA: Diagnosis not present

## 2023-08-14 DIAGNOSIS — M25521 Pain in right elbow: Secondary | ICD-10-CM | POA: Diagnosis not present

## 2023-08-19 DIAGNOSIS — R29898 Other symptoms and signs involving the musculoskeletal system: Secondary | ICD-10-CM | POA: Diagnosis not present

## 2023-08-19 DIAGNOSIS — M25621 Stiffness of right elbow, not elsewhere classified: Secondary | ICD-10-CM | POA: Diagnosis not present

## 2023-08-19 DIAGNOSIS — M25521 Pain in right elbow: Secondary | ICD-10-CM | POA: Diagnosis not present

## 2023-08-19 DIAGNOSIS — G5631 Lesion of radial nerve, right upper limb: Secondary | ICD-10-CM | POA: Diagnosis not present

## 2023-08-28 DIAGNOSIS — G5631 Lesion of radial nerve, right upper limb: Secondary | ICD-10-CM | POA: Diagnosis not present

## 2023-08-28 DIAGNOSIS — R29898 Other symptoms and signs involving the musculoskeletal system: Secondary | ICD-10-CM | POA: Diagnosis not present

## 2023-08-28 DIAGNOSIS — M25521 Pain in right elbow: Secondary | ICD-10-CM | POA: Diagnosis not present

## 2023-08-28 DIAGNOSIS — M25621 Stiffness of right elbow, not elsewhere classified: Secondary | ICD-10-CM | POA: Diagnosis not present

## 2023-09-04 DIAGNOSIS — G5631 Lesion of radial nerve, right upper limb: Secondary | ICD-10-CM | POA: Diagnosis not present

## 2023-09-04 DIAGNOSIS — R29898 Other symptoms and signs involving the musculoskeletal system: Secondary | ICD-10-CM | POA: Diagnosis not present

## 2023-09-04 DIAGNOSIS — M25621 Stiffness of right elbow, not elsewhere classified: Secondary | ICD-10-CM | POA: Diagnosis not present

## 2023-09-04 DIAGNOSIS — M25521 Pain in right elbow: Secondary | ICD-10-CM | POA: Diagnosis not present

## 2023-09-11 DIAGNOSIS — R29898 Other symptoms and signs involving the musculoskeletal system: Secondary | ICD-10-CM | POA: Diagnosis not present

## 2023-09-11 DIAGNOSIS — M25621 Stiffness of right elbow, not elsewhere classified: Secondary | ICD-10-CM | POA: Diagnosis not present

## 2023-09-11 DIAGNOSIS — G5631 Lesion of radial nerve, right upper limb: Secondary | ICD-10-CM | POA: Diagnosis not present

## 2023-09-11 DIAGNOSIS — M25521 Pain in right elbow: Secondary | ICD-10-CM | POA: Diagnosis not present

## 2023-09-17 DIAGNOSIS — S42481D Torus fracture of lower end of right humerus, subsequent encounter for fracture with routine healing: Secondary | ICD-10-CM | POA: Diagnosis not present

## 2023-09-18 DIAGNOSIS — M25521 Pain in right elbow: Secondary | ICD-10-CM | POA: Diagnosis not present

## 2023-09-18 DIAGNOSIS — R29898 Other symptoms and signs involving the musculoskeletal system: Secondary | ICD-10-CM | POA: Diagnosis not present

## 2023-09-18 DIAGNOSIS — M25621 Stiffness of right elbow, not elsewhere classified: Secondary | ICD-10-CM | POA: Diagnosis not present

## 2023-09-18 DIAGNOSIS — G5631 Lesion of radial nerve, right upper limb: Secondary | ICD-10-CM | POA: Diagnosis not present

## 2023-09-25 ENCOUNTER — Other Ambulatory Visit: Payer: Self-pay | Admitting: Family Medicine

## 2023-09-25 DIAGNOSIS — R29898 Other symptoms and signs involving the musculoskeletal system: Secondary | ICD-10-CM | POA: Diagnosis not present

## 2023-09-25 DIAGNOSIS — I10 Essential (primary) hypertension: Secondary | ICD-10-CM

## 2023-09-25 DIAGNOSIS — M25521 Pain in right elbow: Secondary | ICD-10-CM | POA: Diagnosis not present

## 2023-09-25 DIAGNOSIS — G5631 Lesion of radial nerve, right upper limb: Secondary | ICD-10-CM | POA: Diagnosis not present

## 2023-09-25 DIAGNOSIS — M25621 Stiffness of right elbow, not elsewhere classified: Secondary | ICD-10-CM | POA: Diagnosis not present

## 2023-10-09 DIAGNOSIS — R29898 Other symptoms and signs involving the musculoskeletal system: Secondary | ICD-10-CM | POA: Diagnosis not present

## 2023-10-09 DIAGNOSIS — M25621 Stiffness of right elbow, not elsewhere classified: Secondary | ICD-10-CM | POA: Diagnosis not present

## 2023-10-09 DIAGNOSIS — G5631 Lesion of radial nerve, right upper limb: Secondary | ICD-10-CM | POA: Diagnosis not present

## 2023-10-09 DIAGNOSIS — M25521 Pain in right elbow: Secondary | ICD-10-CM | POA: Diagnosis not present

## 2023-10-16 DIAGNOSIS — R29898 Other symptoms and signs involving the musculoskeletal system: Secondary | ICD-10-CM | POA: Diagnosis not present

## 2023-10-16 DIAGNOSIS — M25521 Pain in right elbow: Secondary | ICD-10-CM | POA: Diagnosis not present

## 2023-10-16 DIAGNOSIS — M25621 Stiffness of right elbow, not elsewhere classified: Secondary | ICD-10-CM | POA: Diagnosis not present

## 2023-10-16 DIAGNOSIS — G5631 Lesion of radial nerve, right upper limb: Secondary | ICD-10-CM | POA: Diagnosis not present

## 2023-10-23 DIAGNOSIS — G5631 Lesion of radial nerve, right upper limb: Secondary | ICD-10-CM | POA: Diagnosis not present

## 2023-10-23 DIAGNOSIS — M25521 Pain in right elbow: Secondary | ICD-10-CM | POA: Diagnosis not present

## 2023-10-23 DIAGNOSIS — M25621 Stiffness of right elbow, not elsewhere classified: Secondary | ICD-10-CM | POA: Diagnosis not present

## 2023-10-23 DIAGNOSIS — R29898 Other symptoms and signs involving the musculoskeletal system: Secondary | ICD-10-CM | POA: Diagnosis not present

## 2023-10-27 ENCOUNTER — Encounter: Payer: Self-pay | Admitting: Family Medicine

## 2023-10-27 ENCOUNTER — Ambulatory Visit (INDEPENDENT_AMBULATORY_CARE_PROVIDER_SITE_OTHER): Payer: Medicare PPO | Admitting: Family Medicine

## 2023-10-27 VITALS — BP 118/70 | HR 76 | Temp 97.8°F | Resp 16 | Ht 61.42 in | Wt 168.6 lb

## 2023-10-27 DIAGNOSIS — E785 Hyperlipidemia, unspecified: Secondary | ICD-10-CM

## 2023-10-27 DIAGNOSIS — G5631 Lesion of radial nerve, right upper limb: Secondary | ICD-10-CM | POA: Insufficient documentation

## 2023-10-27 DIAGNOSIS — I10 Essential (primary) hypertension: Secondary | ICD-10-CM

## 2023-10-27 DIAGNOSIS — Z0000 Encounter for general adult medical examination without abnormal findings: Secondary | ICD-10-CM

## 2023-10-27 DIAGNOSIS — F419 Anxiety disorder, unspecified: Secondary | ICD-10-CM

## 2023-10-27 LAB — COMPREHENSIVE METABOLIC PANEL WITH GFR
ALT: 18 U/L (ref 0–35)
AST: 21 U/L (ref 0–37)
Albumin: 4.2 g/dL (ref 3.5–5.2)
Alkaline Phosphatase: 60 U/L (ref 39–117)
BUN: 16 mg/dL (ref 6–23)
CO2: 36 meq/L — ABNORMAL HIGH (ref 19–32)
Calcium: 10.8 mg/dL — ABNORMAL HIGH (ref 8.4–10.5)
Chloride: 100 meq/L (ref 96–112)
Creatinine, Ser: 0.71 mg/dL (ref 0.40–1.20)
GFR: 81.05 mL/min (ref >60.00)
Glucose, Bld: 83 mg/dL (ref 70–99)
Potassium: 3.4 meq/L — ABNORMAL LOW (ref 3.5–5.1)
Sodium: 143 meq/L (ref 135–145)
Total Bilirubin: 0.5 mg/dL (ref 0.2–1.2)
Total Protein: 6.6 g/dL (ref 6.0–8.3)

## 2023-10-27 LAB — LIPID PANEL
Cholesterol: 208 mg/dL — ABNORMAL HIGH (ref 0–200)
HDL: 51.5 mg/dL (ref >39.00)
LDL Cholesterol: 110 mg/dL — ABNORMAL HIGH (ref 0–99)
NonHDL: 156.16
Total CHOL/HDL Ratio: 4
Triglycerides: 231 mg/dL — ABNORMAL HIGH (ref 0.0–149.0)
VLDL: 46.2 mg/dL — ABNORMAL HIGH (ref 0.0–40.0)

## 2023-10-27 MED ORDER — DIAZEPAM 5 MG PO TABS: 2.5000 mg | ORAL_TABLET | Freq: Two times a day (BID) | ORAL | 1 refills | Status: AC | PRN

## 2023-10-27 NOTE — Progress Notes (Signed)
 HPI: [name] is a 78 y.o. female, who is here today for her routine physical.  Last CPE: ***  Regular exercise 3 or more time per week: *** Following a healthy diet: ***  Chronic medical problems: ***   There is no immunization history on file for this patient. Health Maintenance  Topic Date Due   Zoster Vaccines- Shingrix (1 of 2) 01/26/2024 (Originally 09/21/1964)   Pneumococcal Vaccine: 50+ Years (1 of 1 - PCV) 10/26/2024 (Originally 09/21/1995)   Medicare Annual Wellness (AWV)  04/01/2024   HPV VACCINES  Aged Out   Meningococcal B Vaccine  Aged Out   DTaP/Tdap/Td  Discontinued   Influenza Vaccine  Discontinued   DEXA SCAN  Discontinued   COVID-19 Vaccine  Discontinued   Hepatitis C Screening  Discontinued    She has *** concerns today.  Review of Systems  Current Outpatient Medications on File Prior to Visit  Medication Sig Dispense Refill   chlorthalidone  (HYGROTON ) 25 MG tablet TAKE 1 TABLET (25 MG TOTAL) BY MOUTH DAILY. 90 tablet 2   diazepam  (VALIUM ) 5 MG tablet Take 0.5-1 tablets (2.5-5 mg total) by mouth every 12 (twelve) hours as needed for anxiety. 20 tablet 1   Multiple Vitamin (MULTIVITAMIN PO) Take by mouth daily.     furosemide  (LASIX ) 20 MG tablet TAKE 0.5-1 TABLETS (10-20 MG TOTAL) BY MOUTH DAILY AS NEEDED. (Patient not taking: Reported on 10/27/2023) 90 tablet 1   No current facility-administered medications on file prior to visit.    Past Medical History:  Diagnosis Date   Anxiety    Hyperlipidemia    Hypertension    Stroke [facility])     Past Surgical History:  Procedure Laterality Date   APPENDECTOMY     EYE SURGERY     TONSILLECTOMY      Allergies  Allergen Reactions   Erythromycin     IV    Family History  Problem Relation Age of Onset   Arthritis Mother    Heart disease Mother    Arthritis Father    Heart disease Father    Diabetes Maternal Grandfather     Social History   Socioeconomic History   Marital status:  Married    Spouse name: Cheryl   Number of children: 0   Years of education: 16   Highest education level: Bachelor's degree (e.g., BA, AB, BS)  Occupational History   Occupation: retired  Tobacco Use   Smoking status: Former    Current packs/day: 0.00    Types: Cigarettes    Quit date: 05/19/2017    Years since quitting: 6.4   Smokeless tobacco: Never  Substance and Sexual Activity   Alcohol use: No   Drug use: Yes    Types: Benzodiazepines   Sexual activity: Not on file  Other Topics Concern   Not on file  Social History Narrative   Patient is right-handed. She lives with her husband in a one story house. She avoids caffeine. She does not exercise. She is a retired Programmer, systems.   Social Drivers of Corporate investment banker Strain: Low Risk  (04/02/2023)   Overall Financial Resource Strain (CARDIA)    Difficulty of Paying Living Expenses: Not hard at all  Food Insecurity: No Food Insecurity (04/02/2023)   Hunger Vital Sign    Worried About Running Out of Food in the Last Year: Never true    Ran Out of Food in the Last Year: Never true  Transportation Needs: No Transportation Needs (  04/02/2023)   PRAPARE - Administrator, Civil Service (Medical): No    Lack of Transportation (Non-Medical): No  Physical Activity: Inactive (04/02/2023)   Exercise Vital Sign    Days of Exercise per Week: 0 days    Minutes of Exercise per Session: 0 min  Stress: No Stress Concern Present (04/02/2023)   [employer] of Occupational Health - Occupational Stress Questionnaire    Feeling of Stress : Not at all  Social Connections: Socially Integrated (04/02/2023)   Social Connection and Isolation Panel    Frequency of Communication with Friends and Family: More than three times a week    Frequency of Social Gatherings with Friends and Family: More than three times a week    Attends Religious Services: More than 4 times per year    Active Member of [employer] or Organizations: Yes    Attends  Banker Meetings: More than 4 times per year    Marital Status: Married    Vitals:   10/27/23 1100  BP: 118/70  Pulse: 76  Temp: 97.8 F (36.6 C)  SpO2: 97%   Body mass index is 31.43 kg/m.  Wt Readings from Last 3 Encounters:  10/27/23 168 lb 9.6 oz (76.5 kg)  04/02/23 167 lb (75.8 kg)  03/04/23 167 lb 2 oz (75.8 kg)    Physical Exam Vitals and nursing note reviewed.  Constitutional:      General: She is not in acute distress.    Appearance: She is well-developed.  HENT:     Head: Normocephalic and atraumatic.     Right Ear: Tympanic membrane, ear canal and external ear normal.     Left Ear: Tympanic membrane, ear canal and external ear normal.     Ears:     Comments: Hearing aids.    Mouth/Throat:     Mouth: Mucous membranes are moist.     Pharynx: Oropharynx is clear. Uvula midline.  Eyes:     Extraocular Movements: Extraocular movements intact.     Conjunctiva/sclera: Conjunctivae normal.     Pupils: Pupils are equal, round, and reactive to light.  Neck:     Thyroid : No thyromegaly.     Trachea: No tracheal deviation.  Cardiovascular:     Rate and Rhythm: Normal rate and regular rhythm.     Pulses:          Dorsalis pedis pulses are 2+ on the right side and 2+ on the left side.       Posterior tibial pulses are 2+ on the right side and 2+ on the left side.     Heart sounds: No murmur heard. Pulmonary:     Effort: Pulmonary effort is normal. No respiratory distress.     Breath sounds: Normal breath sounds.  Abdominal:     Palpations: Abdomen is soft. There is no hepatomegaly or mass.     Tenderness: There is no abdominal tenderness.  Genitourinary:    Comments: Deferred to gyn. Musculoskeletal:     Comments: Right wrist splint. RUE edema***  Lymphadenopathy:     Cervical: No cervical adenopathy.     Upper Body:     Right upper body: No supraclavicular adenopathy.     Left upper body: No supraclavicular adenopathy.  Skin:    General:  Skin is warm.     Findings: No erythema or rash.  Neurological:     General: No focal deficit present.     Mental Status: She is alert and oriented to  person, place, and time.     Cranial Nerves: No cranial nerve deficit.     Coordination: Coordination normal.     Gait: Gait normal.     Deep Tendon Reflexes:     Reflex Scores:      Bicep reflexes are 2+ on the right side and 2+ on the left side.      Patellar reflexes are 2+ on the right side and 2+ on the left side. Psychiatric:     Comments: Well groomed, good eye contact.     ASSESSMENT AND PLAN: [name] was here today annual physical examination.  No orders of the defined types were placed in this encounter.   Tae Robak was seen today for annual exam.  Diagnoses and all orders for this visit:  Routine general medical examination at a health care facility  Hyperlipidemia, unspecified hyperlipidemia type  Hypertension, essential, benign  Anxiety disorder, unspecified type    Routine general medical examination at a health care facility  Hyperlipidemia, unspecified hyperlipidemia type  Hypertension, essential, benign  Anxiety disorder, unspecified type    No follow-ups on file.  Ajit Errico G. [place], MD  [facility]. [place] [facility].

## 2023-10-27 NOTE — Patient Instructions (Addendum)
 A few things to remember from today's visit:  Routine general medical examination at a health care facility  Hyperlipidemia, unspecified hyperlipidemia type - Plan: Comprehensive metabolic panel with GFR, Lipid panel  Hypertension, essential, benign - Plan: Comprehensive metabolic panel with GFR  Anxiety disorder, unspecified type - Plan: diazepam  (VALIUM ) 5 MG tablet  If you need refills for medications you take chronically, please call your pharmacy. Do not use My Chart to request refills or for acute issues that need immediate attention. If you send a my chart message, it may take a few days to be addressed, specially if I am not in the office.  Please be sure medication list is accurate. If a new problem present, please set up appointment sooner than planned today.

## 2023-10-30 ENCOUNTER — Ambulatory Visit: Payer: Self-pay | Admitting: Family Medicine

## 2023-10-30 DIAGNOSIS — M25521 Pain in right elbow: Secondary | ICD-10-CM | POA: Diagnosis not present

## 2023-10-30 DIAGNOSIS — M25621 Stiffness of right elbow, not elsewhere classified: Secondary | ICD-10-CM | POA: Diagnosis not present

## 2023-10-30 DIAGNOSIS — G5631 Lesion of radial nerve, right upper limb: Secondary | ICD-10-CM | POA: Diagnosis not present

## 2023-10-30 DIAGNOSIS — E876 Hypokalemia: Secondary | ICD-10-CM

## 2023-10-30 DIAGNOSIS — R29898 Other symptoms and signs involving the musculoskeletal system: Secondary | ICD-10-CM | POA: Diagnosis not present

## 2023-10-30 MED ORDER — POTASSIUM CHLORIDE ER 10 MEQ PO TBCR: 20.0000 meq | EXTENDED_RELEASE_TABLET | Freq: Every day | ORAL | 0 refills | Status: AC

## 2023-11-06 DIAGNOSIS — R29898 Other symptoms and signs involving the musculoskeletal system: Secondary | ICD-10-CM | POA: Diagnosis not present

## 2023-11-06 DIAGNOSIS — M25521 Pain in right elbow: Secondary | ICD-10-CM | POA: Diagnosis not present

## 2023-11-06 DIAGNOSIS — G5631 Lesion of radial nerve, right upper limb: Secondary | ICD-10-CM | POA: Diagnosis not present

## 2023-11-06 DIAGNOSIS — M25621 Stiffness of right elbow, not elsewhere classified: Secondary | ICD-10-CM | POA: Diagnosis not present

## 2023-11-17 DIAGNOSIS — S42481D Torus fracture of lower end of right humerus, subsequent encounter for fracture with routine healing: Secondary | ICD-10-CM | POA: Diagnosis not present

## 2023-11-20 DIAGNOSIS — R29898 Other symptoms and signs involving the musculoskeletal system: Secondary | ICD-10-CM | POA: Diagnosis not present

## 2023-11-20 DIAGNOSIS — G5631 Lesion of radial nerve, right upper limb: Secondary | ICD-10-CM | POA: Diagnosis not present

## 2023-11-20 DIAGNOSIS — M25521 Pain in right elbow: Secondary | ICD-10-CM | POA: Diagnosis not present

## 2023-11-20 DIAGNOSIS — M25621 Stiffness of right elbow, not elsewhere classified: Secondary | ICD-10-CM | POA: Diagnosis not present

## 2023-11-27 DIAGNOSIS — R29898 Other symptoms and signs involving the musculoskeletal system: Secondary | ICD-10-CM | POA: Diagnosis not present

## 2023-11-27 DIAGNOSIS — M25621 Stiffness of right elbow, not elsewhere classified: Secondary | ICD-10-CM | POA: Diagnosis not present

## 2023-11-27 DIAGNOSIS — G5631 Lesion of radial nerve, right upper limb: Secondary | ICD-10-CM | POA: Diagnosis not present

## 2023-11-27 DIAGNOSIS — M25521 Pain in right elbow: Secondary | ICD-10-CM | POA: Diagnosis not present

## 2023-12-04 DIAGNOSIS — R29898 Other symptoms and signs involving the musculoskeletal system: Secondary | ICD-10-CM | POA: Diagnosis not present

## 2023-12-04 DIAGNOSIS — M25521 Pain in right elbow: Secondary | ICD-10-CM | POA: Diagnosis not present

## 2023-12-04 DIAGNOSIS — G5631 Lesion of radial nerve, right upper limb: Secondary | ICD-10-CM | POA: Diagnosis not present

## 2023-12-04 DIAGNOSIS — M25621 Stiffness of right elbow, not elsewhere classified: Secondary | ICD-10-CM | POA: Diagnosis not present

## 2023-12-18 DIAGNOSIS — R29898 Other symptoms and signs involving the musculoskeletal system: Secondary | ICD-10-CM | POA: Diagnosis not present

## 2023-12-18 DIAGNOSIS — M25621 Stiffness of right elbow, not elsewhere classified: Secondary | ICD-10-CM | POA: Diagnosis not present

## 2023-12-18 DIAGNOSIS — M25521 Pain in right elbow: Secondary | ICD-10-CM | POA: Diagnosis not present

## 2023-12-18 DIAGNOSIS — G5631 Lesion of radial nerve, right upper limb: Secondary | ICD-10-CM | POA: Diagnosis not present

## 2024-01-01 DIAGNOSIS — G5631 Lesion of radial nerve, right upper limb: Secondary | ICD-10-CM | POA: Diagnosis not present

## 2024-01-01 DIAGNOSIS — R29898 Other symptoms and signs involving the musculoskeletal system: Secondary | ICD-10-CM | POA: Diagnosis not present

## 2024-01-01 DIAGNOSIS — M25521 Pain in right elbow: Secondary | ICD-10-CM | POA: Diagnosis not present

## 2024-01-01 DIAGNOSIS — M25621 Stiffness of right elbow, not elsewhere classified: Secondary | ICD-10-CM | POA: Diagnosis not present

## 2024-01-09 DIAGNOSIS — S42481D Torus fracture of lower end of right humerus, subsequent encounter for fracture with routine healing: Secondary | ICD-10-CM | POA: Diagnosis not present

## 2024-01-12 DIAGNOSIS — M25621 Stiffness of right elbow, not elsewhere classified: Secondary | ICD-10-CM | POA: Diagnosis not present

## 2024-01-12 DIAGNOSIS — R29898 Other symptoms and signs involving the musculoskeletal system: Secondary | ICD-10-CM | POA: Diagnosis not present

## 2024-01-12 DIAGNOSIS — M25521 Pain in right elbow: Secondary | ICD-10-CM | POA: Diagnosis not present

## 2024-01-12 DIAGNOSIS — G5631 Lesion of radial nerve, right upper limb: Secondary | ICD-10-CM | POA: Diagnosis not present

## 2024-04-07 ENCOUNTER — Ambulatory Visit: Payer: Medicare PPO

## 2024-10-27 ENCOUNTER — Encounter: Admitting: Family Medicine
